# Patient Record
Sex: Female | Born: 1965 | Race: Black or African American | Hispanic: No | Smoking: Never smoker
Health system: Southern US, Community
[De-identification: ages and names within clinical notes are randomized; demographics above are authoritative.]

## PROBLEM LIST (undated history)

## (undated) DIAGNOSIS — J309 Allergic rhinitis, unspecified: Secondary | ICD-10-CM

## (undated) DIAGNOSIS — J45909 Unspecified asthma, uncomplicated: Secondary | ICD-10-CM

## (undated) DIAGNOSIS — I10 Essential (primary) hypertension: Secondary | ICD-10-CM

## (undated) DIAGNOSIS — E559 Vitamin D deficiency, unspecified: Secondary | ICD-10-CM

## (undated) DIAGNOSIS — Z9109 Other allergy status, other than to drugs and biological substances: Secondary | ICD-10-CM

## (undated) DIAGNOSIS — N920 Excessive and frequent menstruation with regular cycle: Secondary | ICD-10-CM

## (undated) DIAGNOSIS — R5383 Other fatigue: Secondary | ICD-10-CM

## (undated) DIAGNOSIS — M1990 Unspecified osteoarthritis, unspecified site: Secondary | ICD-10-CM

## (undated) DIAGNOSIS — R630 Anorexia: Secondary | ICD-10-CM

## (undated) HISTORY — DX: Vitamin D deficiency, unspecified: E55.9

## (undated) HISTORY — DX: Anorexia: R63.0

## (undated) HISTORY — DX: Excessive and frequent menstruation with regular cycle: N92.0

## (undated) HISTORY — PX: GASTRIC BYPASS: SHX52

## (undated) HISTORY — DX: Other fatigue: R53.83

## (undated) HISTORY — DX: Allergic rhinitis, unspecified: J30.9

---

## 2006-02-17 ENCOUNTER — Ambulatory Visit: Payer: Self-pay | Admitting: Unknown Physician Specialty

## 2009-03-04 ENCOUNTER — Ambulatory Visit: Payer: Self-pay | Admitting: Unknown Physician Specialty

## 2010-04-09 ENCOUNTER — Ambulatory Visit: Payer: Self-pay | Admitting: Unknown Physician Specialty

## 2011-04-13 ENCOUNTER — Ambulatory Visit: Payer: Self-pay | Admitting: Family Medicine

## 2012-05-25 ENCOUNTER — Ambulatory Visit: Payer: Self-pay | Admitting: Family Medicine

## 2013-08-09 ENCOUNTER — Ambulatory Visit: Payer: Self-pay | Admitting: Family Medicine

## 2014-09-02 ENCOUNTER — Other Ambulatory Visit: Payer: Self-pay | Admitting: Family Medicine

## 2014-09-02 DIAGNOSIS — Z1231 Encounter for screening mammogram for malignant neoplasm of breast: Secondary | ICD-10-CM

## 2014-09-03 ENCOUNTER — Ambulatory Visit
Admission: RE | Admit: 2014-09-03 | Discharge: 2014-09-03 | Disposition: A | Discharge status: Home / Self Care | Payer: BC Managed Care – PPO | Source: Ambulatory Visit | Attending: Family Medicine | Admitting: Family Medicine

## 2014-09-03 DIAGNOSIS — Z1231 Encounter for screening mammogram for malignant neoplasm of breast: Secondary | ICD-10-CM | POA: Insufficient documentation

## 2014-10-22 ENCOUNTER — Ambulatory Visit
Admission: RE | Admit: 2014-10-22 | Discharge: 2014-10-22 | Disposition: A | Discharge status: Home / Self Care | Payer: Worker's Compensation | Source: Ambulatory Visit | Attending: Physician Assistant | Admitting: Physician Assistant

## 2014-10-22 ENCOUNTER — Other Ambulatory Visit: Payer: Self-pay | Admitting: Physician Assistant

## 2014-10-22 DIAGNOSIS — M25562 Pain in left knee: Secondary | ICD-10-CM

## 2014-10-22 DIAGNOSIS — M25552 Pain in left hip: Secondary | ICD-10-CM

## 2014-12-27 ENCOUNTER — Ambulatory Visit (INDEPENDENT_AMBULATORY_CARE_PROVIDER_SITE_OTHER): Payer: BC Managed Care – PPO | Admitting: Family Medicine

## 2014-12-27 ENCOUNTER — Encounter: Payer: Self-pay | Admitting: Family Medicine

## 2014-12-27 VITALS — BP 142/80 | HR 86 | Temp 98.9°F | Resp 20 | Wt 263.0 lb

## 2014-12-27 DIAGNOSIS — J069 Acute upper respiratory infection, unspecified: Secondary | ICD-10-CM | POA: Diagnosis not present

## 2014-12-27 DIAGNOSIS — J301 Allergic rhinitis due to pollen: Secondary | ICD-10-CM | POA: Diagnosis not present

## 2014-12-27 DIAGNOSIS — I10 Essential (primary) hypertension: Secondary | ICD-10-CM

## 2014-12-27 DIAGNOSIS — J309 Allergic rhinitis, unspecified: Secondary | ICD-10-CM | POA: Insufficient documentation

## 2014-12-27 MED ORDER — HYDROCODONE-HOMATROPINE 5-1.5 MG/5ML PO SYRP: ORAL_SOLUTION | ORAL | Status: DC

## 2014-12-27 NOTE — Progress Notes (Signed)
Subjective:     Patient ID: Caitlin Salazar, female   DOB: [birth year], 49 y.o.   MRN: [id number]  HPI  Chief Complaint  Patient presents with  . URI    X 1 week. Patient reports that she has congestion in her head. She has been taking OTC Sudafed and Tylenol with no relief. She has had chills, but no body aches. Stuffy nose, ear fullness, and water eyes. Patient reports that she has been taking her allergy pill on a daily basis. She uses a humidifer at night. Patient reports that she also has a non-porductive cough.   States sx started on 10/10 with headache and sore throat. Now reports early purulent sinus congestion, sinus pressure, and PND with accompanying cough.   Review of Systems  Constitutional: Negative for fever.       Objective:   Physical Exam  Constitutional: She appears well-developed and well-nourished. No distress.  Ears: T.M's intact without inflammation Throat: no tonsillar enlargement or exudate Neck: no cervical adenopathy Lungs: clear     Assessment:    1. Upper respiratory infection - HYDROcodone-homatropine (HYCODAN) 5-1.5 MG/5ML syrup; 5 ml 4-6 hours as needed for cough  Dispense: 240 mL; Refill: 0    Plan:    Discussed use of Mucinex D and Delsym. Will call if sinuses not improving over the next two days.

## 2014-12-27 NOTE — Patient Instructions (Signed)
Discussed use of Mucinex D for congestion and Delsym for cough. 

## 2015-01-01 ENCOUNTER — Other Ambulatory Visit: Payer: Self-pay | Admitting: Obstetrics and Gynecology

## 2015-01-02 ENCOUNTER — Other Ambulatory Visit: Payer: Self-pay | Admitting: Obstetrics and Gynecology

## 2015-01-02 DIAGNOSIS — Z13820 Encounter for screening for osteoporosis: Secondary | ICD-10-CM

## 2015-01-08 ENCOUNTER — Ambulatory Visit: Payer: BC Managed Care – PPO

## 2015-01-14 ENCOUNTER — Ambulatory Visit
Admission: RE | Admit: 2015-01-14 | Discharge: 2015-01-14 | Disposition: A | Discharge status: Home / Self Care | Payer: BC Managed Care – PPO | Source: Ambulatory Visit | Attending: Obstetrics and Gynecology | Admitting: Obstetrics and Gynecology

## 2015-01-14 DIAGNOSIS — Z780 Asymptomatic menopausal state: Secondary | ICD-10-CM | POA: Diagnosis not present

## 2015-01-14 DIAGNOSIS — Z13820 Encounter for screening for osteoporosis: Secondary | ICD-10-CM

## 2015-01-16 ENCOUNTER — Ambulatory Visit: Payer: BC Managed Care – PPO

## 2015-02-28 ENCOUNTER — Other Ambulatory Visit: Payer: Self-pay | Admitting: *Deleted

## 2015-02-28 MED ORDER — FLUTICASONE PROPIONATE 50 MCG/ACT NA SUSP: 2.0000 | Freq: Every day | NASAL | Status: DC

## 2015-02-28 NOTE — Telephone Encounter (Signed)
Last ov 12/27/2014.

## 2015-03-03 ENCOUNTER — Telehealth: Payer: Self-pay | Admitting: Gastroenterology

## 2015-03-03 NOTE — Telephone Encounter (Signed)
Colonoscopy triage °

## 2015-03-13 ENCOUNTER — Other Ambulatory Visit: Payer: Self-pay

## 2015-03-13 ENCOUNTER — Telehealth: Payer: Self-pay

## 2015-03-13 DIAGNOSIS — E669 Obesity, unspecified: Secondary | ICD-10-CM | POA: Insufficient documentation

## 2015-03-13 DIAGNOSIS — I10 Essential (primary) hypertension: Secondary | ICD-10-CM | POA: Insufficient documentation

## 2015-03-13 DIAGNOSIS — E559 Vitamin D deficiency, unspecified: Secondary | ICD-10-CM | POA: Insufficient documentation

## 2015-03-13 DIAGNOSIS — Z8619 Personal history of other infectious and parasitic diseases: Secondary | ICD-10-CM | POA: Insufficient documentation

## 2015-03-13 DIAGNOSIS — R609 Edema, unspecified: Secondary | ICD-10-CM | POA: Insufficient documentation

## 2015-03-13 DIAGNOSIS — N920 Excessive and frequent menstruation with regular cycle: Secondary | ICD-10-CM | POA: Insufficient documentation

## 2015-03-13 DIAGNOSIS — R5383 Other fatigue: Secondary | ICD-10-CM | POA: Insufficient documentation

## 2015-03-13 DIAGNOSIS — Z8709 Personal history of other diseases of the respiratory system: Secondary | ICD-10-CM | POA: Insufficient documentation

## 2015-03-13 NOTE — Telephone Encounter (Signed)
Pt scheduled for screening colonoscopy at [facility] on Friday, Jan 27th.

## 2015-03-13 NOTE — Telephone Encounter (Signed)
Gastroenterology Pre-Procedure Review  Request Date: 04/11/15 Requesting Physician: Dr. Caryn Section  PATIENT REVIEW QUESTIONS: The patient responded to the following health history questions as indicated:    1. Are you having any GI issues? no 2. Do you have a personal history of Polyps? no 3. Do you have a family history of Colon Cancer or Polyps? no 4. Diabetes Mellitus? no 5. Joint replacements in the past 12 months?no 6. Major health problems in the past 3 months?no 7. Any artificial heart valves, MVP, or defibrillator?no    MEDICATIONS & ALLERGIES:    Patient reports the following regarding taking any anticoagulation/antiplatelet therapy:   Plavix, Coumadin, Eliquis, Xarelto, Lovenox, Pradaxa, Brilinta, or Effient? no Aspirin? no  Patient confirms/reports the following medications:  Current Outpatient Prescriptions  Medication Sig Dispense Refill  . albuterol (VENTOLIN HFA) 108 (90 Base) MCG/ACT inhaler Inhale into the lungs.    . fluticasone (FLONASE) 50 MCG/ACT nasal spray Place 2 sprays into both nostrils daily. 16 g 5  . ibuprofen (ADVIL,MOTRIN) 800 MG tablet daily as needed.    Marland Kitchen lisinopril (PRINIVIL,ZESTRIL) 20 MG tablet Take by mouth daily.    . meloxicam (MOBIC) 15 MG tablet Take by mouth daily as needed.    . methocarbamol (ROBAXIN) 500 MG tablet Take by mouth daily. Reported on 03/13/2015    . phentermine 30 MG capsule Take by mouth.    . topiramate (TOPAMAX) 50 MG tablet Take by mouth.    . traMADol (ULTRAM) 50 MG tablet Take by mouth every 6 (six) hours as needed.    . valACYclovir (VALTREX) 1000 MG tablet Take by mouth.    . furosemide (LASIX) 20 MG tablet Take by mouth. Reported on 03/13/2015     No current facility-administered medications for this visit.    Patient confirms/reports the following allergies:  Allergies  Allergen Reactions  . Penicillins     No orders of the defined types were placed in this encounter.    AUTHORIZATION INFORMATION Primary  Insurance: 1D#: Group #:  Secondary Insurance: 1D#: Group #:  SCHEDULE INFORMATION: Date: 04/11/15 Time: Location: [place]

## 2015-04-07 ENCOUNTER — Encounter: Payer: Self-pay | Admitting: *Deleted

## 2015-04-09 NOTE — Discharge Instructions (Signed)

## 2015-04-11 ENCOUNTER — Encounter
Admission: RE | Disposition: A | Discharge status: Home / Self Care | Payer: Self-pay | Source: Ambulatory Visit | Attending: Gastroenterology

## 2015-04-11 ENCOUNTER — Ambulatory Visit: Payer: BC Managed Care – PPO | Admitting: Anesthesiology

## 2015-04-11 ENCOUNTER — Other Ambulatory Visit: Payer: Self-pay | Admitting: Gastroenterology

## 2015-04-11 ENCOUNTER — Ambulatory Visit
Admission: RE | Admit: 2015-04-11 | Discharge: 2015-04-11 | Disposition: A | Discharge status: Home / Self Care | Payer: BC Managed Care – PPO | Source: Ambulatory Visit | Attending: Gastroenterology | Admitting: Gastroenterology

## 2015-04-11 DIAGNOSIS — Z9884 Bariatric surgery status: Secondary | ICD-10-CM | POA: Insufficient documentation

## 2015-04-11 DIAGNOSIS — D123 Benign neoplasm of transverse colon: Secondary | ICD-10-CM | POA: Diagnosis not present

## 2015-04-11 DIAGNOSIS — Z880 Allergy status to penicillin: Secondary | ICD-10-CM | POA: Insufficient documentation

## 2015-04-11 DIAGNOSIS — Z79899 Other long term (current) drug therapy: Secondary | ICD-10-CM | POA: Diagnosis not present

## 2015-04-11 DIAGNOSIS — I10 Essential (primary) hypertension: Secondary | ICD-10-CM | POA: Diagnosis not present

## 2015-04-11 DIAGNOSIS — K641 Second degree hemorrhoids: Secondary | ICD-10-CM | POA: Diagnosis not present

## 2015-04-11 DIAGNOSIS — D122 Benign neoplasm of ascending colon: Secondary | ICD-10-CM | POA: Insufficient documentation

## 2015-04-11 DIAGNOSIS — M1712 Unilateral primary osteoarthritis, left knee: Secondary | ICD-10-CM | POA: Insufficient documentation

## 2015-04-11 DIAGNOSIS — Z7951 Long term (current) use of inhaled steroids: Secondary | ICD-10-CM | POA: Insufficient documentation

## 2015-04-11 DIAGNOSIS — J45909 Unspecified asthma, uncomplicated: Secondary | ICD-10-CM | POA: Diagnosis not present

## 2015-04-11 DIAGNOSIS — Z1211 Encounter for screening for malignant neoplasm of colon: Secondary | ICD-10-CM | POA: Diagnosis not present

## 2015-04-11 HISTORY — DX: Other allergy status, other than to drugs and biological substances: Z91.09

## 2015-04-11 HISTORY — DX: Unspecified asthma, uncomplicated: J45.909

## 2015-04-11 HISTORY — DX: Essential (primary) hypertension: I10

## 2015-04-11 HISTORY — DX: Unspecified osteoarthritis, unspecified site: M19.90

## 2015-04-11 HISTORY — PX: POLYPECTOMY: SHX5525

## 2015-04-11 HISTORY — PX: COLONOSCOPY WITH PROPOFOL: SHX5780

## 2015-04-11 SURGERY — COLONOSCOPY WITH PROPOFOL
Anesthesia: Monitor Anesthesia Care | Wound class: Contaminated

## 2015-04-11 MED ORDER — LIDOCAINE HCL (CARDIAC) 20 MG/ML IV SOLN
INTRAVENOUS | Status: DC | PRN
  Administered 2015-04-11: 50 mg via INTRAVENOUS

## 2015-04-11 MED ORDER — SODIUM CHLORIDE 0.9 % IV SOLN: INTRAVENOUS | Status: DC

## 2015-04-11 MED ORDER — ACETAMINOPHEN 325 MG PO TABS: 325.0000 mg | ORAL_TABLET | ORAL | Status: DC | PRN

## 2015-04-11 MED ORDER — ACETAMINOPHEN 160 MG/5ML PO SOLN: 325.0000 mg | ORAL | Status: DC | PRN

## 2015-04-11 MED ORDER — PROPOFOL 10 MG/ML IV BOLUS
INTRAVENOUS | Status: DC | PRN
  Administered 2015-04-11: 10 mg via INTRAVENOUS
  Administered 2015-04-11: 30 mg via INTRAVENOUS
  Administered 2015-04-11: 50 mg via INTRAVENOUS
  Administered 2015-04-11: 10 mg via INTRAVENOUS
  Administered 2015-04-11 (×3): 20 mg via INTRAVENOUS
  Administered 2015-04-11: 30 mg via INTRAVENOUS
  Administered 2015-04-11 (×2): 20 mg via INTRAVENOUS
  Administered 2015-04-11: 30 mg via INTRAVENOUS
  Administered 2015-04-11: 20 mg via INTRAVENOUS

## 2015-04-11 MED ORDER — STERILE WATER FOR IRRIGATION IR SOLN
Status: DC | PRN
  Administered 2015-04-11: 10:00:00

## 2015-04-11 MED ORDER — LACTATED RINGERS IV SOLN
INTRAVENOUS | Status: DC
  Administered 2015-04-11: 09:00:00 via INTRAVENOUS

## 2015-04-11 SURGICAL SUPPLY — 28 items

## 2015-04-11 NOTE — Op Note (Signed)
[facility] Gastroenterology Patient Name: Caitlin Salazar Procedure Date: 04/11/2015 9:45 AM MRN: [id number] Account #: [id number] Date of Birth: [birth year] Admit Type: Outpatient Age: 50 Room: [facility] OR ROOM 01 Gender: Female Note Status: Finalized Procedure:         Colonoscopy Indications:       Screening for colorectal malignant neoplasm Providers:         Lucilla Lame, MD Referring MD:      Kirstie Peri. Caryn Section, MD (Referring MD) Medicines:         Propofol per Anesthesia Complications:     No immediate complications. Procedure:         Pre-Anesthesia Assessment:                    - Prior to the procedure, a History and Physical was                     performed, and patient medications and allergies were                     reviewed. The patient's tolerance of previous anesthesia                     was also reviewed. The risks and benefits of the procedure                     and the sedation options and risks were discussed with the                     patient. All questions were answered, and informed consent                     was obtained. Prior Anticoagulants: The patient has taken                     no previous anticoagulant or antiplatelet agents. ASA                     Grade Assessment: II - A patient with mild systemic                     disease. After reviewing the risks and benefits, the                     patient was deemed in satisfactory condition to undergo                     the procedure.                    After obtaining informed consent, the colonoscope was                     passed under direct vision. Throughout the procedure, the                     patient's blood pressure, pulse, and oxygen saturations                     were monitored continuously. The Olympus CF-HQ190L                     Colonoscope (S#. [id number]) was introduced through the anus  and advanced to the the cecum, identified by appendiceal                orifice and ileocecal valve. The colonoscopy was performed                     without difficulty. The patient tolerated the procedure                     well. The quality of the bowel preparation was excellent. Findings:      The perianal and digital rectal examinations were normal.      A 4 mm polyp was found in the ascending colon. The polyp was sessile.       The polyp was removed with a cold biopsy forceps. Resection and       retrieval were complete.      A 4 mm polyp was found in the transverse colon. The polyp was sessile.       The polyp was removed with a cold biopsy forceps. Resection and       retrieval were complete.      Non-bleeding internal hemorrhoids were found during retroflexion. The       hemorrhoids were Grade II (internal hemorrhoids that prolapse but reduce       spontaneously). Impression:        - One 4 mm polyp in the ascending colon. Resected and                     retrieved.                    - One 4 mm polyp in the transverse colon. Resected and                     retrieved.                    - Non-bleeding internal hemorrhoids. Recommendation:    - Await pathology results.                    - Repeat colonoscopy in 5 years if polyp adenoma and 10                     years if hyperplastic Procedure Code(s): --- Professional ---                    [phone], Colonoscopy, flexible; with biopsy, single or                     multiple Diagnosis Code(s): --- Professional ---                    Z12.11, Encounter for screening for malignant neoplasm of                     colon                    D12.2, Benign neoplasm of ascending colon                    D12.3, Benign neoplasm of transverse colon CPT copyright 2014 American Medical Association. All rights reserved. The codes documented in this report are preliminary and upon coder review may  be revised to meet current compliance requirements. Lucilla Lame, MD 04/11/2015 10:17:30 AM This report  has been signed  electronically. Number of Addenda: 0 Note Initiated On: 04/11/2015 9:45 AM Scope Withdrawal Time: 0 hours 8 minutes 51 seconds  Total Procedure Duration: 0 hours 14 minutes 14 seconds       [facility]

## 2015-04-11 NOTE — Anesthesia Postprocedure Evaluation (Signed)
Anesthesia Post Note  Patient: Caitlin Salazar  Procedure(s) Performed: Procedure(s) (LRB): COLONOSCOPY WITH PROPOFOL (N/A) POLYPECTOMY  Patient location during evaluation: PACU Anesthesia Type: MAC Level of consciousness: awake and alert and oriented Pain management: satisfactory to patient Vital Signs Assessment: post-procedure vital signs reviewed and stable Respiratory status: spontaneous breathing, nonlabored ventilation and respiratory function stable Cardiovascular status: blood pressure returned to baseline and stable Postop Assessment: Adequate PO intake and No signs of nausea or vomiting Anesthetic complications: no    Raliegh Ip

## 2015-04-11 NOTE — Anesthesia Preprocedure Evaluation (Signed)
Anesthesia Evaluation  Patient identified by MRN, date of birth, ID band  Reviewed: Allergy & Precautions, H&P , NPO status , Patient's Chart, lab work & pertinent test results  Airway Mallampati: II  TM Distance: >3 FB Neck ROM: full    Dental no notable dental hx.    Pulmonary asthma ,    Pulmonary exam normal        Cardiovascular hypertension,  Rhythm:regular Rate:Normal     Neuro/Psych    GI/Hepatic   Endo/Other    Renal/GU      Musculoskeletal   Abdominal   Peds  Hematology   Anesthesia Other Findings   Reproductive/Obstetrics                             Anesthesia Physical Anesthesia Plan  ASA: II  Anesthesia Plan: MAC   Post-op Pain Management:    Induction:   Airway Management Planned:   Additional Equipment:   Intra-op Plan:   Post-operative Plan:   Informed Consent: I have reviewed the patients History and Physical, chart, labs and discussed the procedure including the risks, benefits and alternatives for the proposed anesthesia with the patient or authorized representative who has indicated his/her understanding and acceptance.     Plan Discussed with: CRNA  Anesthesia Plan Comments:         Anesthesia Quick Evaluation

## 2015-04-11 NOTE — Anesthesia Procedure Notes (Signed)
Procedure Name: MAC Performed by: Eryn Krejci Pre-anesthesia Checklist: Patient identified, Emergency Drugs available, Suction available, Timeout performed and Patient being monitored Patient Re-evaluated:Patient Re-evaluated prior to inductionOxygen Delivery Method: Nasal cannula Placement Confirmation: positive ETCO2       

## 2015-04-11 NOTE — Transfer of Care (Signed)
Immediate Anesthesia Transfer of Care Note  Patient: Caitlin Salazar  Procedure(s) Performed: Procedure(s) with comments: COLONOSCOPY WITH PROPOFOL (N/A) - PLEASE LEAVE PT START TIME FOR 930 POLYPECTOMY  Patient Location: PACU  Anesthesia Type: MAC  Level of Consciousness: awake, alert  and patient cooperative  Airway and Oxygen Therapy: Patient Spontanous Breathing and Patient connected to supplemental oxygen  Post-op Assessment: Post-op Vital signs reviewed, Patient's Cardiovascular Status Stable, Respiratory Function Stable, Patent Airway and No signs of Nausea or vomiting  Post-op Vital Signs: Reviewed and stable  Complications: No apparent anesthesia complications

## 2015-04-11 NOTE — H&P (Signed)
[facility]  [address] [place], [place] [address] Phone: [phone] Fax : [phone]  Primary Care Physician:  Lelon Huh, MD Primary Gastroenterologist:  Dr. Allen Norris  Pre-Procedure History & Physical: HPI:  Caitlin Salazar is a 50 y.o. female is here for a screening colonoscopy.   Past Medical History  Diagnosis Date  . Environmental allergies   . Hypertension   . Asthma     when younger  . Arthritis     left knee    Past Surgical History  Procedure Laterality Date  . Gastric bypass    . Cesarean section      Prior to Admission medications   Medication Sig Start Date End Date Taking? Authorizing Provider  ibuprofen (ADVIL,MOTRIN) 800 MG tablet daily as needed. 10/22/14  Yes Historical Provider, MD  levocetirizine (XYZAL) 5 MG tablet Take 5 mg by mouth daily. AM   Yes Historical Provider, MD  lisinopril (PRINIVIL,ZESTRIL) 20 MG tablet Take by mouth daily. AM 12/06/14  Yes Historical Provider, MD  valACYclovir (VALTREX) 1000 MG tablet Take by mouth as needed.    Yes Historical Provider, MD  albuterol (VENTOLIN HFA) 108 (90 Base) MCG/ACT inhaler Inhale into the lungs. Reported on 04/11/2015 10/12/13   Historical Provider, MD  fluticasone (FLONASE) 50 MCG/ACT nasal spray Place 2 sprays into both nostrils daily. Patient not taking: Reported on 04/11/2015 02/28/15   Birdie Sons, MD  furosemide (LASIX) 20 MG tablet Take by mouth daily as needed. Reported on 04/11/2015 11/07/12   Historical Provider, MD  meloxicam (MOBIC) 15 MG tablet Take by mouth daily as needed. Reported on 04/11/2015 12/12/14   Historical Provider, MD  methocarbamol (ROBAXIN) 500 MG tablet Take by mouth daily as needed. Reported on 04/11/2015 12/12/14   Historical Provider, MD  phentermine 30 MG capsule Take by mouth as needed. Reported on 04/11/2015 05/31/14   Historical Provider, MD    Allergies as of 03/13/2015 - Review Complete 03/13/2015  Allergen Reaction Noted  . Penicillins   03/13/2015    History reviewed. No pertinent family history.  Social History   Social History  . Marital Status: Married    Spouse Name: N/A  . Number of Children: N/A  . Years of Education: N/A   Occupational History  . Not on file.   Social History Main Topics  . Smoking status: Never Smoker   . Smokeless tobacco: Not on file  . Alcohol Use: 0.0 oz/week    0 Standard drinks or equivalent per week     Comment: 1-2x/yr  . Drug Use: No  . Sexual Activity: Not on file   Other Topics Concern  . Not on file   Social History Narrative    Review of Systems: See HPI, otherwise negative ROS  Physical Exam: BP 179/89 mmHg  Pulse 73  Temp(Src) 97.5 F (36.4 C) (Temporal)  Resp 18  Ht 5\' 3"  (1.6 m)  Wt 269 lb (122.018 kg)  BMI 47.66 kg/m2  SpO2 100%  LMP 02/05/2015 (Approximate) General:   Alert,  pleasant and cooperative in NAD Head:  Normocephalic and atraumatic. Neck:  Supple; no masses or thyromegaly. Lungs:  Clear throughout to auscultation.    Heart:  Regular rate and rhythm. Abdomen:  Soft, nontender and nondistended. Normal bowel sounds, without guarding, and without rebound.   Neurologic:  Alert and  oriented x4;  grossly normal neurologically.  Impression/Plan: Caitlin Salazar is now here to undergo a screening colonoscopy.  Risks, benefits, and alternatives regarding  colonoscopy have been reviewed with the patient.  Questions have been answered.  All parties agreeable.

## 2015-04-14 ENCOUNTER — Encounter: Payer: Self-pay | Admitting: Gastroenterology

## 2015-04-15 ENCOUNTER — Encounter: Payer: Self-pay | Admitting: Gastroenterology

## 2015-09-03 ENCOUNTER — Other Ambulatory Visit: Payer: Self-pay | Admitting: Family Medicine

## 2015-10-07 ENCOUNTER — Other Ambulatory Visit: Payer: Self-pay | Admitting: Family Medicine

## 2015-10-07 DIAGNOSIS — Z1231 Encounter for screening mammogram for malignant neoplasm of breast: Secondary | ICD-10-CM

## 2015-10-13 ENCOUNTER — Encounter: Payer: Self-pay | Admitting: Radiology

## 2015-10-13 ENCOUNTER — Ambulatory Visit
Admission: RE | Admit: 2015-10-13 | Discharge: 2015-10-13 | Disposition: A | Discharge status: Home / Self Care | Payer: BC Managed Care – PPO | Source: Ambulatory Visit | Attending: Family Medicine | Admitting: Family Medicine

## 2015-10-13 DIAGNOSIS — Z1231 Encounter for screening mammogram for malignant neoplasm of breast: Secondary | ICD-10-CM | POA: Insufficient documentation

## 2016-01-19 ENCOUNTER — Other Ambulatory Visit: Payer: Self-pay | Admitting: Obstetrics and Gynecology

## 2016-01-19 DIAGNOSIS — Z1231 Encounter for screening mammogram for malignant neoplasm of breast: Secondary | ICD-10-CM

## 2016-01-19 DIAGNOSIS — Z13820 Encounter for screening for osteoporosis: Secondary | ICD-10-CM

## 2016-10-13 ENCOUNTER — Ambulatory Visit
Admission: RE | Admit: 2016-10-13 | Discharge: 2016-10-13 | Disposition: A | Discharge status: Home / Self Care | Payer: BC Managed Care – PPO | Source: Ambulatory Visit | Attending: Obstetrics and Gynecology | Admitting: Obstetrics and Gynecology

## 2016-10-13 DIAGNOSIS — Z1231 Encounter for screening mammogram for malignant neoplasm of breast: Secondary | ICD-10-CM | POA: Insufficient documentation

## 2016-10-13 DIAGNOSIS — J45909 Unspecified asthma, uncomplicated: Secondary | ICD-10-CM | POA: Insufficient documentation

## 2016-10-13 DIAGNOSIS — Z13820 Encounter for screening for osteoporosis: Secondary | ICD-10-CM

## 2016-10-13 DIAGNOSIS — Z780 Asymptomatic menopausal state: Secondary | ICD-10-CM | POA: Insufficient documentation

## 2016-12-12 ENCOUNTER — Ambulatory Visit (INDEPENDENT_AMBULATORY_CARE_PROVIDER_SITE_OTHER): Payer: BC Managed Care – PPO

## 2016-12-12 ENCOUNTER — Ambulatory Visit
Admission: EM | Admit: 2016-12-12 | Discharge: 2016-12-12 | Disposition: A | Discharge status: Home / Self Care | Payer: BC Managed Care – PPO | Attending: Family Medicine | Admitting: Family Medicine

## 2016-12-12 DIAGNOSIS — M791 Myalgia: Secondary | ICD-10-CM

## 2016-12-12 DIAGNOSIS — M5440 Lumbago with sciatica, unspecified side: Secondary | ICD-10-CM

## 2016-12-12 DIAGNOSIS — S161XXA Strain of muscle, fascia and tendon at neck level, initial encounter: Secondary | ICD-10-CM

## 2016-12-12 DIAGNOSIS — M62838 Other muscle spasm: Secondary | ICD-10-CM

## 2016-12-12 DIAGNOSIS — M7918 Myalgia, other site: Secondary | ICD-10-CM

## 2016-12-12 MED ORDER — METAXALONE 800 MG PO TABS: 800.0000 mg | ORAL_TABLET | Freq: Three times a day (TID) | ORAL | 0 refills | Status: DC

## 2016-12-12 MED ORDER — MELOXICAM 15 MG PO TABS: 15.0000 mg | ORAL_TABLET | Freq: Every day | ORAL | 0 refills | Status: DC
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
## 2017-03-21 DIAGNOSIS — G8929 Other chronic pain: Secondary | ICD-10-CM | POA: Insufficient documentation

## 2017-03-21 DIAGNOSIS — M7918 Myalgia, other site: Secondary | ICD-10-CM | POA: Insufficient documentation

## 2017-04-28 ENCOUNTER — Ambulatory Visit
Admission: RE | Admit: 2017-04-28 | Discharge: 2017-04-28 | Disposition: A | Discharge status: Home / Self Care | Payer: BC Managed Care – PPO | Source: Ambulatory Visit | Attending: Family Medicine | Admitting: Family Medicine

## 2017-04-28 ENCOUNTER — Other Ambulatory Visit: Payer: Self-pay | Admitting: Family Medicine

## 2017-04-28 DIAGNOSIS — M7989 Other specified soft tissue disorders: Secondary | ICD-10-CM | POA: Insufficient documentation

## 2017-04-28 DIAGNOSIS — M25571 Pain in right ankle and joints of right foot: Secondary | ICD-10-CM

## 2017-04-28 DIAGNOSIS — M79662 Pain in left lower leg: Secondary | ICD-10-CM

## 2017-04-28 DIAGNOSIS — M79661 Pain in right lower leg: Secondary | ICD-10-CM | POA: Insufficient documentation

## 2017-11-28 ENCOUNTER — Other Ambulatory Visit: Payer: Self-pay | Admitting: Obstetrics and Gynecology

## 2017-11-28 DIAGNOSIS — Z1231 Encounter for screening mammogram for malignant neoplasm of breast: Secondary | ICD-10-CM

## 2017-11-29 ENCOUNTER — Ambulatory Visit
Admission: RE | Admit: 2017-11-29 | Discharge: 2017-11-29 | Disposition: A | Discharge status: Home / Self Care | Payer: BC Managed Care – PPO | Source: Ambulatory Visit | Attending: Obstetrics and Gynecology | Admitting: Obstetrics and Gynecology

## 2017-11-29 ENCOUNTER — Encounter (INDEPENDENT_AMBULATORY_CARE_PROVIDER_SITE_OTHER): Payer: Self-pay

## 2017-11-29 ENCOUNTER — Ambulatory Visit: Payer: Self-pay

## 2017-11-29 DIAGNOSIS — Z1231 Encounter for screening mammogram for malignant neoplasm of breast: Secondary | ICD-10-CM | POA: Insufficient documentation

## 2017-12-08 ENCOUNTER — Other Ambulatory Visit: Payer: Self-pay | Admitting: Obstetrics and Gynecology

## 2017-12-08 DIAGNOSIS — N6489 Other specified disorders of breast: Secondary | ICD-10-CM

## 2017-12-08 DIAGNOSIS — R928 Other abnormal and inconclusive findings on diagnostic imaging of breast: Secondary | ICD-10-CM

## 2017-12-15 ENCOUNTER — Ambulatory Visit
Admission: RE | Admit: 2017-12-15 | Discharge: 2017-12-15 | Disposition: A | Discharge status: Home / Self Care | Payer: BC Managed Care – PPO | Source: Ambulatory Visit | Attending: Obstetrics and Gynecology | Admitting: Obstetrics and Gynecology

## 2017-12-15 DIAGNOSIS — R928 Other abnormal and inconclusive findings on diagnostic imaging of breast: Secondary | ICD-10-CM | POA: Diagnosis not present

## 2017-12-15 DIAGNOSIS — N6489 Other specified disorders of breast: Secondary | ICD-10-CM

## 2017-12-26 ENCOUNTER — Other Ambulatory Visit: Payer: Self-pay | Admitting: Obstetrics and Gynecology

## 2017-12-26 DIAGNOSIS — N6320 Unspecified lump in the left breast, unspecified quadrant: Secondary | ICD-10-CM

## 2017-12-26 DIAGNOSIS — R928 Other abnormal and inconclusive findings on diagnostic imaging of breast: Secondary | ICD-10-CM

## 2018-01-06 ENCOUNTER — Ambulatory Visit
Admission: RE | Admit: 2018-01-06 | Discharge: 2018-01-06 | Disposition: A | Discharge status: Home / Self Care | Payer: BC Managed Care – PPO | Source: Ambulatory Visit | Attending: Obstetrics and Gynecology | Admitting: Obstetrics and Gynecology

## 2018-01-06 DIAGNOSIS — R928 Other abnormal and inconclusive findings on diagnostic imaging of breast: Secondary | ICD-10-CM

## 2018-01-06 DIAGNOSIS — N6320 Unspecified lump in the left breast, unspecified quadrant: Secondary | ICD-10-CM

## 2018-01-06 HISTORY — PX: BREAST BIOPSY: SHX20

## 2018-01-09 LAB — SURGICAL PATHOLOGY

## 2018-11-15 ENCOUNTER — Other Ambulatory Visit: Payer: Self-pay

## 2018-11-15 MED ORDER — LISINOPRIL 20 MG PO TABS: 20.0000 mg | ORAL_TABLET | Freq: Every day | ORAL | 3 refills | Status: DC

## 2019-01-11 ENCOUNTER — Other Ambulatory Visit: Payer: Self-pay | Admitting: Obstetrics and Gynecology

## 2019-01-11 DIAGNOSIS — Z1231 Encounter for screening mammogram for malignant neoplasm of breast: Secondary | ICD-10-CM

## 2019-01-15 ENCOUNTER — Inpatient Hospital Stay: Admission: RE | Admit: 2019-01-15 | Payer: BC Managed Care – PPO | Source: Ambulatory Visit

## 2019-01-19 ENCOUNTER — Other Ambulatory Visit: Payer: Self-pay

## 2019-01-19 DIAGNOSIS — Z20822 Contact with and (suspected) exposure to covid-19: Secondary | ICD-10-CM

## 2019-01-20 LAB — NOVEL CORONAVIRUS, NAA: SARS-CoV-2, NAA: NOT DETECTED

## 2019-01-29 ENCOUNTER — Other Ambulatory Visit: Payer: Self-pay

## 2019-01-29 DIAGNOSIS — Z20822 Contact with and (suspected) exposure to covid-19: Secondary | ICD-10-CM

## 2019-01-30 LAB — INPATIENT

## 2019-01-30 LAB — NOVEL CORONAVIRUS, NAA: SARS-CoV-2, NAA: NOT DETECTED

## 2019-02-05 ENCOUNTER — Other Ambulatory Visit: Payer: Self-pay

## 2019-02-06 ENCOUNTER — Other Ambulatory Visit: Payer: Self-pay

## 2019-02-06 DIAGNOSIS — Z20822 Contact with and (suspected) exposure to covid-19: Secondary | ICD-10-CM

## 2019-02-08 LAB — NOVEL CORONAVIRUS, NAA: SARS-CoV-2, NAA: NOT DETECTED

## 2019-02-12 ENCOUNTER — Ambulatory Visit (LOCAL_COMMUNITY_HEALTH_CENTER): Payer: BC Managed Care – PPO

## 2019-02-12 ENCOUNTER — Other Ambulatory Visit: Payer: Self-pay

## 2019-02-12 DIAGNOSIS — Z23 Encounter for immunization: Secondary | ICD-10-CM | POA: Diagnosis not present

## 2019-02-12 DIAGNOSIS — Z111 Encounter for screening for respiratory tuberculosis: Secondary | ICD-10-CM

## 2019-02-14 ENCOUNTER — Ambulatory Visit
Admission: RE | Admit: 2019-02-14 | Discharge: 2019-02-14 | Disposition: A | Discharge status: Home / Self Care | Payer: BC Managed Care – PPO | Source: Ambulatory Visit | Attending: Obstetrics and Gynecology | Admitting: Obstetrics and Gynecology

## 2019-02-14 ENCOUNTER — Other Ambulatory Visit: Payer: Self-pay

## 2019-02-14 DIAGNOSIS — Z1231 Encounter for screening mammogram for malignant neoplasm of breast: Secondary | ICD-10-CM | POA: Insufficient documentation

## 2019-02-14 DIAGNOSIS — Z20822 Contact with and (suspected) exposure to covid-19: Secondary | ICD-10-CM

## 2019-02-15 ENCOUNTER — Ambulatory Visit (LOCAL_COMMUNITY_HEALTH_CENTER): Payer: BC Managed Care – PPO

## 2019-02-15 ENCOUNTER — Other Ambulatory Visit: Payer: Self-pay

## 2019-02-15 ENCOUNTER — Other Ambulatory Visit: Payer: BC Managed Care – PPO

## 2019-02-15 DIAGNOSIS — Z111 Encounter for screening for respiratory tuberculosis: Secondary | ICD-10-CM

## 2019-02-15 LAB — TB SKIN TEST
Induration: 0 mm
TB Skin Test: NEGATIVE

## 2019-02-16 LAB — NOVEL CORONAVIRUS, NAA: SARS-CoV-2, NAA: NOT DETECTED

## 2019-03-23 ENCOUNTER — Telehealth: Payer: Self-pay | Admitting: Registered Nurse

## 2019-03-23 ENCOUNTER — Encounter: Payer: Self-pay | Admitting: Registered Nurse

## 2019-03-23 MED ORDER — FLUTICASONE PROPIONATE 50 MCG/ACT NA SUSP: 2.0000 | Freq: Every day | NASAL | 5 refills | Status: AC

## 2019-03-23 NOTE — Telephone Encounter (Signed)
Requesting refill of flonase, electronic Rx sent to her pharmacy of choice.

## 2019-04-30 ENCOUNTER — Ambulatory Visit: Payer: BC Managed Care – PPO

## 2019-05-07 ENCOUNTER — Ambulatory Visit: Payer: Self-pay | Admitting: Physician Assistant

## 2019-05-07 ENCOUNTER — Other Ambulatory Visit: Payer: Self-pay

## 2019-05-07 ENCOUNTER — Encounter: Payer: Self-pay | Admitting: Physician Assistant

## 2019-05-07 DIAGNOSIS — Z299 Encounter for prophylactic measures, unspecified: Secondary | ICD-10-CM

## 2019-05-07 DIAGNOSIS — Z113 Encounter for screening for infections with a predominantly sexual mode of transmission: Secondary | ICD-10-CM

## 2019-05-07 LAB — WET PREP FOR TRICH, YEAST, CLUE
Trichomonas Exam: NEGATIVE
Yeast Exam: NEGATIVE

## 2019-05-07 MED ORDER — CLOTRIMAZOLE 1 % VA CREA: 1.0000 | TOPICAL_CREAM | Freq: Every day | VAGINAL | 0 refills | Status: AC

## 2019-05-07 NOTE — Progress Notes (Signed)
[facility] Department STI clinic/screening visit  Subjective:  Caitlin Salazar is a 54 y.o. female being seen today for an STI screening visit. The patient reports they do have symptoms.  Patient reports that they do not desire a pregnancy in the next year.   They reported they are not interested in discussing contraception today.  Patient's last menstrual period was 09/29/2016.   Patient has the following medical conditions:   Patient Active Problem List   Diagnosis Date Noted  . Special screening for malignant neoplasms, colon   . Benign neoplasm of ascending colon   . Benign neoplasm of transverse colon   . History of chicken pox 03/13/2015  . Accumulation of fluid in tissues 03/13/2015  . Fatigue 03/13/2015  . History of asthma 03/13/2015  . BP (high blood pressure) 03/13/2015  . Adiposity 03/13/2015  . Excess, menstruation 03/13/2015  . Avitaminosis D 03/13/2015  . Hypertension 12/27/2014  . Allergic rhinitis 12/27/2014    Chief Complaint  Patient presents with  . SEXUALLY TRANSMITTED DISEASE    HPI  Patient reports that she would like a screening today due to having a new partner and having some burning for a couple of days that has now resolved.  Patient reports that she is postmenopausal and has been for about 2 years.   See flowsheet for further details and programmatic requirements.    The following portions of the patient's history were reviewed and updated as appropriate: allergies, current medications, past medical history, past social history, past surgical history and problem list.  Objective:  There were no vitals filed for this visit.  Physical Exam Constitutional:      General: She is not in acute distress.    Appearance: Normal appearance. She is obese.  HENT:     Head: Normocephalic and atraumatic.     Comments: No nits, lice, or hair loss. No cervical, supraclavicular or axillary adenopathy.    Mouth/Throat:     Mouth: Mucous  membranes are moist.     Pharynx: Oropharynx is clear. No oropharyngeal exudate or posterior oropharyngeal erythema.  Eyes:     Conjunctiva/sclera: Conjunctivae normal.  Pulmonary:     Effort: Pulmonary effort is normal.  Abdominal:     Palpations: Abdomen is soft. There is no mass.     Tenderness: There is no abdominal tenderness. There is no guarding or rebound.  Genitourinary:    General: Normal vulva.     Rectum: Normal.     Comments: External genitalia/pubic area without nits, lice, edema, erythema, lesions and inguinal adenopathy. Vagina with mucosa dry and friable, no discharge visualized. Cervix without visible lesions. Uterus firm, mobile, nt, no masses, no CMT, no adnexal tenderness. Musculoskeletal:     Cervical back: Neck supple. No tenderness.  Skin:    General: Skin is warm and dry.     Findings: No bruising, erythema, lesion or rash.  Neurological:     Mental Status: She is alert and oriented to person, place, and time.  Psychiatric:        Mood and Affect: Mood normal.        Behavior: Behavior normal.        Thought Content: Thought content normal.        Judgment: Judgment normal.      Assessment and Plan:  [name] is a 54 y.o. female presenting to the [facility] Department for STI screening  1. Screening for STD (sexually transmitted disease) Patient into clinic with  symptoms. Rec condoms with all sex. Await test results.  Counseled that RN will call if needs to RTC for further treatment once results are back.  - WET PREP FOR [place], YEAST, CLUE - Chlamydia/Gonorrhea [place] [facility] - HIV [place] [facility] - Syphilis Serology, [place] [facility]  2. Prophylactic measure Will treat for possible yeast with Clotrimazole 1% cream 1 app qhs for 7 days. Counseled patient that yeast can lead to burning and vaginal dryness. - clotrimazole (CLOTRIMAZOLE-7) 1 % vaginal cream; Place 1 Applicatorful vaginally at bedtime for 7 days.  Dispense: 45 g; Refill:  0     No follow-ups on file.  No future appointments.  Jerene Dilling, PA

## 2020-01-23 ENCOUNTER — Other Ambulatory Visit: Payer: Self-pay | Admitting: Obstetrics and Gynecology

## 2020-01-23 DIAGNOSIS — Z1231 Encounter for screening mammogram for malignant neoplasm of breast: Secondary | ICD-10-CM

## 2020-01-25 ENCOUNTER — Other Ambulatory Visit: Payer: Self-pay

## 2020-01-25 ENCOUNTER — Ambulatory Visit (LOCAL_COMMUNITY_HEALTH_CENTER): Payer: Self-pay

## 2020-01-25 DIAGNOSIS — Z111 Encounter for screening for respiratory tuberculosis: Secondary | ICD-10-CM

## 2020-01-28 ENCOUNTER — Ambulatory Visit (LOCAL_COMMUNITY_HEALTH_CENTER): Payer: BC Managed Care – PPO

## 2020-01-28 ENCOUNTER — Other Ambulatory Visit: Payer: Self-pay

## 2020-01-28 DIAGNOSIS — Z111 Encounter for screening for respiratory tuberculosis: Secondary | ICD-10-CM

## 2020-01-28 LAB — TB SKIN TEST
Induration: 0 mm
TB Skin Test: NEGATIVE

## 2020-02-11 ENCOUNTER — Encounter: Payer: Self-pay | Admitting: Emergency Medicine

## 2020-02-11 ENCOUNTER — Emergency Department
Admission: EM | Admit: 2020-02-11 | Discharge: 2020-02-11 | Disposition: A | Discharge status: Home / Self Care | Payer: BC Managed Care – PPO | Attending: Emergency Medicine | Admitting: Emergency Medicine

## 2020-02-11 ENCOUNTER — Other Ambulatory Visit: Payer: Self-pay

## 2020-02-11 ENCOUNTER — Emergency Department: Payer: BC Managed Care – PPO

## 2020-02-11 DIAGNOSIS — J45909 Unspecified asthma, uncomplicated: Secondary | ICD-10-CM | POA: Insufficient documentation

## 2020-02-11 DIAGNOSIS — Z79899 Other long term (current) drug therapy: Secondary | ICD-10-CM | POA: Insufficient documentation

## 2020-02-11 DIAGNOSIS — I10 Essential (primary) hypertension: Secondary | ICD-10-CM | POA: Diagnosis not present

## 2020-02-11 DIAGNOSIS — M25571 Pain in right ankle and joints of right foot: Secondary | ICD-10-CM | POA: Insufficient documentation

## 2020-02-11 DIAGNOSIS — S8002XA Contusion of left knee, initial encounter: Secondary | ICD-10-CM | POA: Insufficient documentation

## 2020-02-11 DIAGNOSIS — S8992XA Unspecified injury of left lower leg, initial encounter: Secondary | ICD-10-CM | POA: Diagnosis present

## 2020-02-11 MED ORDER — NAPROXEN 500 MG PO TABS: 500.0000 mg | ORAL_TABLET | Freq: Two times a day (BID) | ORAL | 2 refills | Status: DC
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
## 2020-02-19 ENCOUNTER — Telehealth: Payer: BC Managed Care – PPO

## 2020-02-20 ENCOUNTER — Other Ambulatory Visit: Payer: Self-pay

## 2020-02-20 ENCOUNTER — Ambulatory Visit
Admission: RE | Admit: 2020-02-20 | Discharge: 2020-02-20 | Disposition: A | Discharge status: Home / Self Care | Payer: BC Managed Care – PPO | Source: Ambulatory Visit | Attending: Obstetrics and Gynecology | Admitting: Obstetrics and Gynecology

## 2020-02-20 DIAGNOSIS — Z1231 Encounter for screening mammogram for malignant neoplasm of breast: Secondary | ICD-10-CM | POA: Diagnosis not present

## 2020-08-18 IMAGING — MG DIGITAL SCREENING BILATERAL MAMMOGRAM WITH TOMO AND CAD
8 of 12 series · 8 of 28 positions shown · non-contrast
Comparison: Previous exam(s).

CLINICAL DATA: Screening.

EXAM:
DIGITAL SCREENING BILATERAL MAMMOGRAM WITH TOMO AND CAD

[R CC synth-2D]
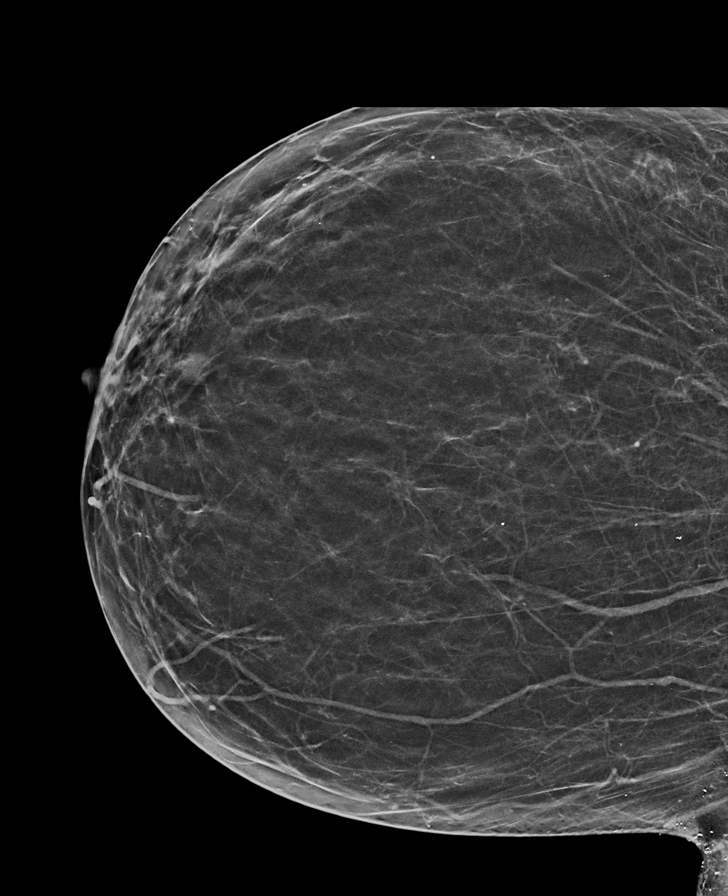

[L CC synth-2D]
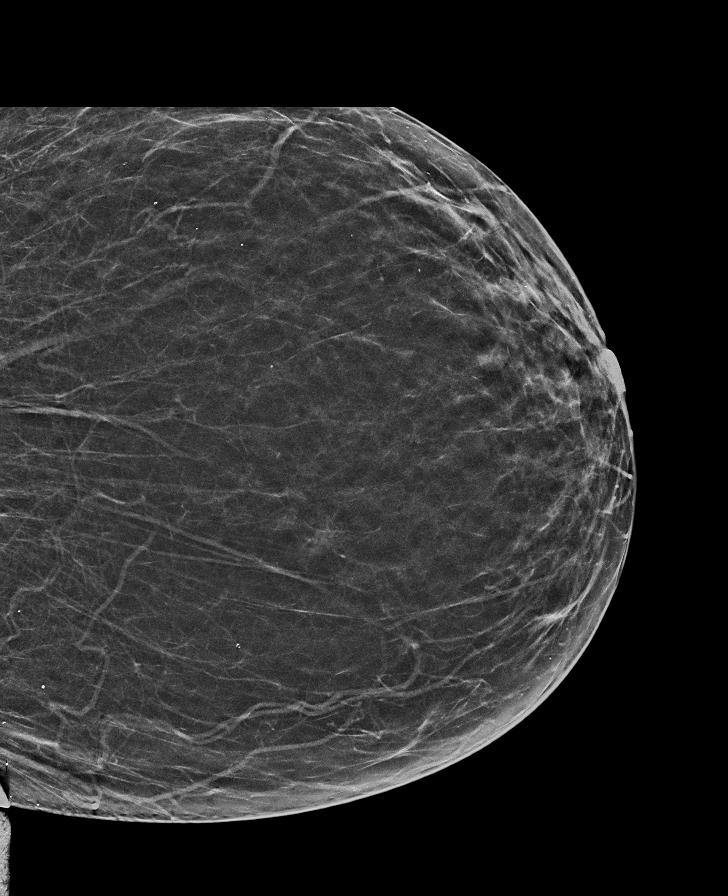

[R MLO synth-2D]
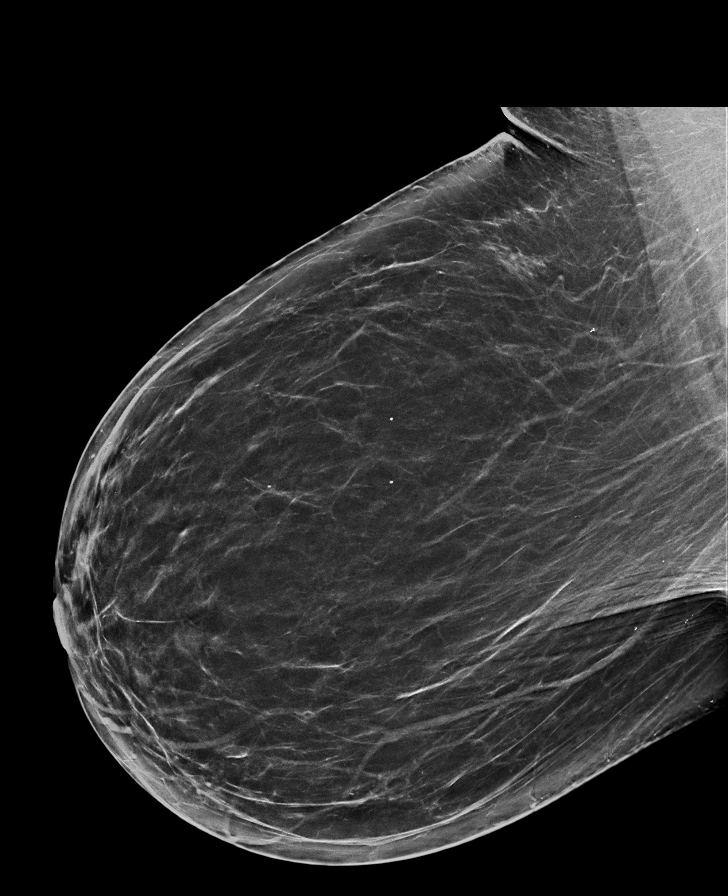

[L MLO synth-2D]
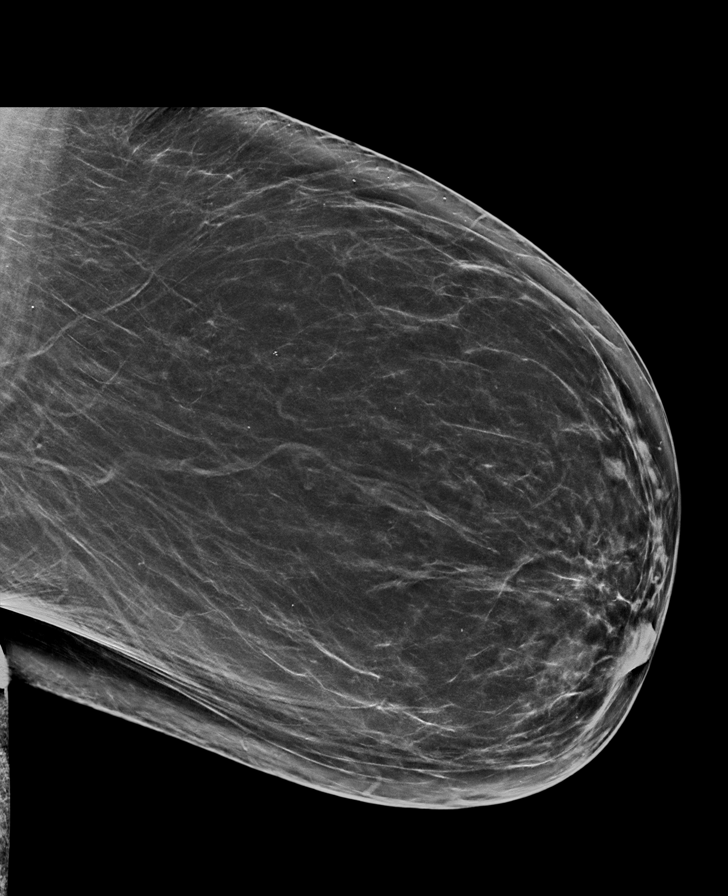

[L CC (1 of 2)]
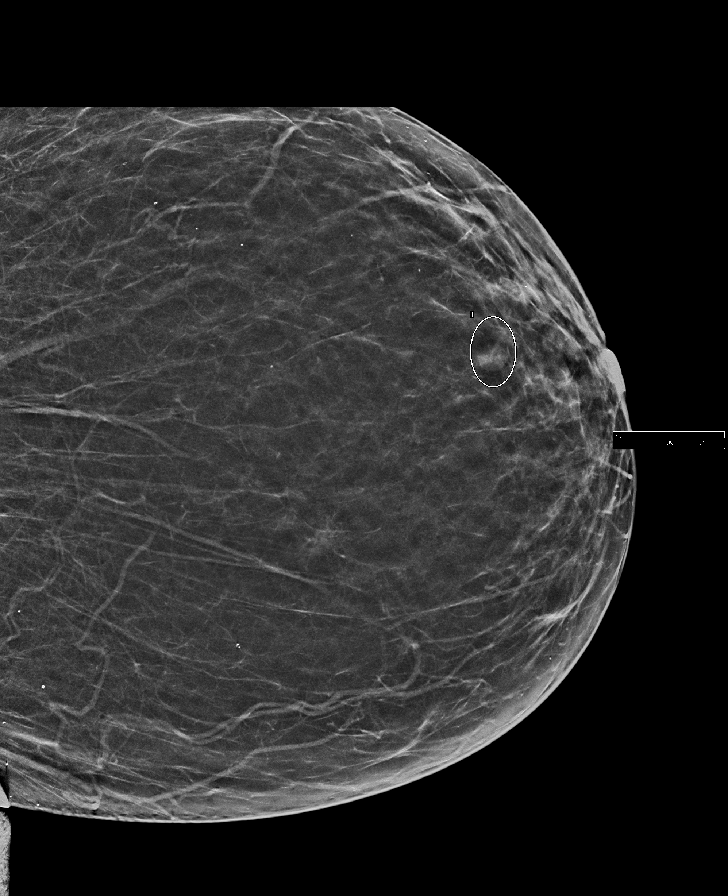

[L MLO (1 of 2)]
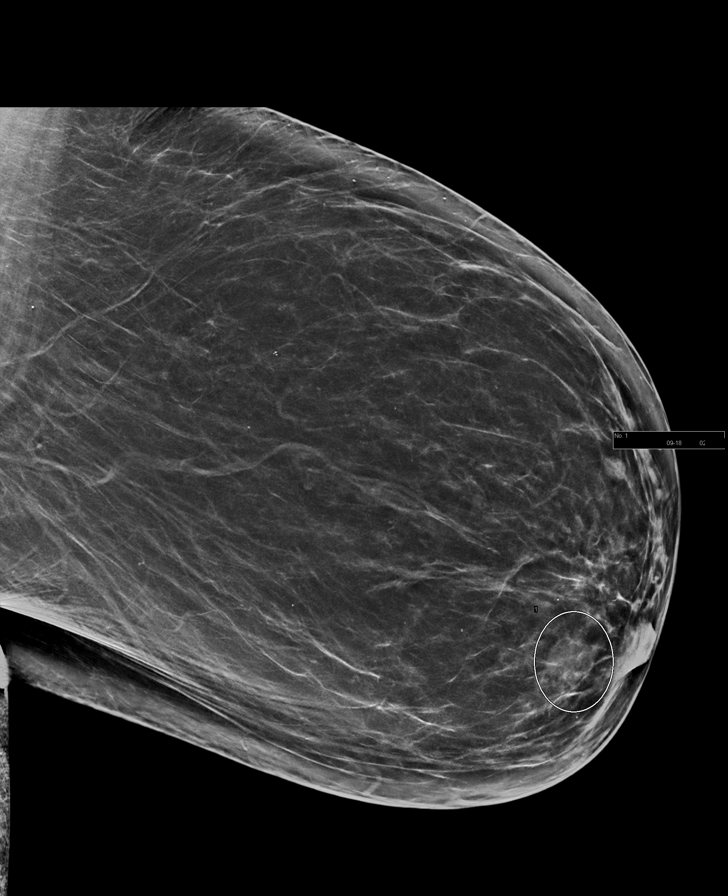

[L MLO (2 of 2)]
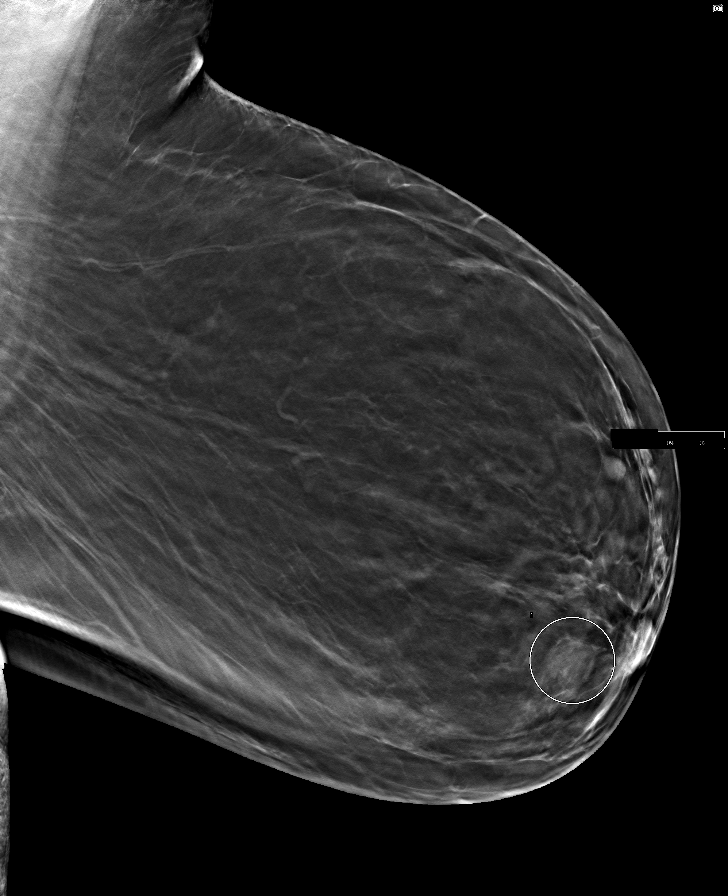

[L CC (2 of 2)]
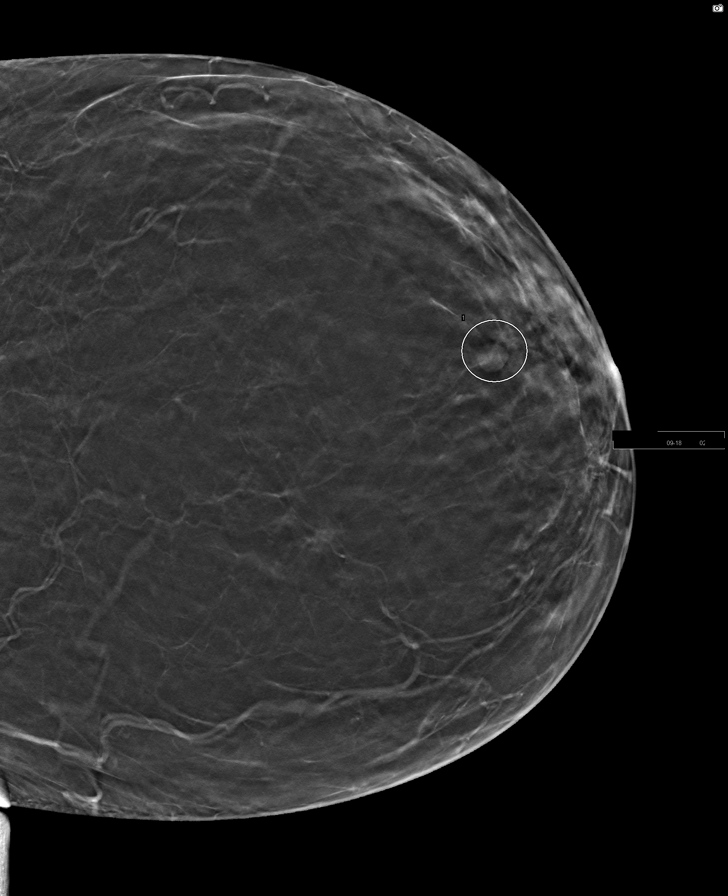

[8 of 28 positions shown; findings below may reference images not displayed]

ACR Breast Density Category b: There are scattered areas of
fibroglandular density.
FINDINGS: In the left breast, a possible asymmetry warrants further
evaluation. In the right breast, no findings suspicious for
malignancy. Images were processed with CAD.
IMPRESSION: Further evaluation is suggested for possible asymmetry in the left
breast.

RECOMMENDATION:
Diagnostic mammogram and possibly ultrasound of the left breast.
(Code:DI-5-LL4)

The patient will be contacted regarding the findings, and additional
imaging will be scheduled.

BI-RADS CATEGORY  0: Incomplete. Need additional imaging evaluation
and/or prior mammograms for comparison.

## 2020-08-26 ENCOUNTER — Other Ambulatory Visit: Payer: Self-pay

## 2020-08-26 ENCOUNTER — Ambulatory Visit: Payer: Self-pay | Admitting: Advanced Practice Midwife

## 2020-08-26 ENCOUNTER — Encounter: Payer: Self-pay | Admitting: Advanced Practice Midwife

## 2020-08-26 DIAGNOSIS — Z113 Encounter for screening for infections with a predominantly sexual mode of transmission: Secondary | ICD-10-CM

## 2020-08-26 DIAGNOSIS — T7411XA Adult physical abuse, confirmed, initial encounter: Secondary | ICD-10-CM | POA: Insufficient documentation

## 2020-08-26 DIAGNOSIS — T7411XS Adult physical abuse, confirmed, sequela: Secondary | ICD-10-CM

## 2020-08-26 LAB — WET PREP FOR TRICH, YEAST, CLUE
Trichomonas Exam: NEGATIVE
Yeast Exam: NEGATIVE

## 2020-08-26 NOTE — Progress Notes (Signed)
Wet mount reviewed, no tx per standing orders. Provider orders completed.

## 2020-08-26 NOTE — Progress Notes (Signed)
[facility] Department STI clinic/screening visit  Subjective:  Caitlin Salazar is a 55 y.o. seperated BF G1P1 nonsmoker female being seen today for an STI screening visit because her primary care MD is closed.  The patient reports they do have symptoms.  Patient reports that they do not desire a pregnancy in the next year.   They reported they are not interested in discussing contraception today.  Patient's last menstrual period was 09/29/2016.   Patient has the following medical conditions:   Patient Active Problem List   Diagnosis Date Noted   Special screening for malignant neoplasms, colon    Benign neoplasm of ascending colon    Benign neoplasm of transverse colon    History of chicken pox 03/13/2015   Accumulation of fluid in tissues 03/13/2015   Fatigue 03/13/2015   History of asthma 03/13/2015   BP (high blood pressure) 03/13/2015   Adiposity 03/13/2015   Excess, menstruation 03/13/2015   Avitaminosis D 03/13/2015   Hypertension 12/27/2014   Allergic rhinitis 12/27/2014    Chief Complaint  Patient presents with   SEXUALLY TRANSMITTED DISEASE    screening    HPI  Patient reports finished antibiotics on 08/23/20 for sinus infection and began having internal burning when voids so used Monistat cream on 6/10, 6/11, and 08/24/20 without relief. Last sex 06/2020 with condom; with current partner x 3 mo. LMP 09/29/16. Last MJ age 28. Last ETOH 08/24/20 (2 wine coolers)1x/mo.  Last HIV test per patient/review of record was 05/07/19 Patient reports last pap was 04/2020 neg  See flowsheet for further details and programmatic requirements.    The following portions of the patient's history were reviewed and updated as appropriate: allergies, current medications, past medical history, past social history, past surgical history and problem list.  Objective:  There were no vitals filed for this visit.  Physical Exam Vitals and nursing note reviewed.   Constitutional:      Appearance: Normal appearance. She is obese.  HENT:     Head: Normocephalic and atraumatic.     Mouth/Throat:     Mouth: Mucous membranes are moist.     Pharynx: Oropharynx is clear. No oropharyngeal exudate or posterior oropharyngeal erythema.  Pulmonary:     Effort: Pulmonary effort is normal.  Chest:  Breasts:    Right: No axillary adenopathy or supraclavicular adenopathy.     Left: No axillary adenopathy or supraclavicular adenopathy.  Abdominal:     General: Abdomen is protuberant.     Palpations: Abdomen is soft. There is no mass.     Tenderness: There is no abdominal tenderness. There is no guarding or rebound.     Comments: Increased adipose, poor tone, soft without masses or tenderness  Genitourinary:    General: Normal vulva.     Exam position: Lithotomy position.     Pubic Area: No rash or pubic lice.      Labia:        Right: No rash or lesion.        Left: No rash or lesion.      Vagina: Normal. No vaginal discharge (no leukorrhea seen, ph inconclusive), erythema, bleeding or lesions.     Cervix: Normal.     Rectum: Normal.     Comments: Unable to assess uterus or adnexa due to increased adipose tissue Lymphadenopathy:     Head:     Right side of head: No preauricular or posterior auricular adenopathy.     Left side of head: No  preauricular or posterior auricular adenopathy.     Cervical: No cervical adenopathy.     Upper Body:     Right upper body: No supraclavicular or axillary adenopathy.     Left upper body: No supraclavicular or axillary adenopathy.     Lower Body: No right inguinal adenopathy. No left inguinal adenopathy.  Skin:    General: Skin is warm and dry.     Findings: No rash.  Neurological:     Mental Status: She is alert and oriented to person, place, and time.     Assessment and Plan:  Caitlin Salazar is a 55 y.o. female presenting to the [facility] Department for STI screening  1. Screening  examination for venereal disease Treat wet mount per standing orders Immunization nurse consult - WET PREP FOR [place], YEAST, CLUE - Syphilis Serology, [place] [facility] - HIV [place] [facility] - Chlamydia/Gonorrhea [place] [facility]     No follow-ups on file.  No future appointments.  Herbie Saxon, CNM

## 2020-10-14 ENCOUNTER — Ambulatory Visit: Payer: BC Managed Care – PPO | Admitting: Podiatry

## 2020-10-14 ENCOUNTER — Other Ambulatory Visit: Payer: Self-pay

## 2020-10-14 ENCOUNTER — Ambulatory Visit (INDEPENDENT_AMBULATORY_CARE_PROVIDER_SITE_OTHER): Payer: BC Managed Care – PPO

## 2020-10-14 DIAGNOSIS — S9002XA Contusion of left ankle, initial encounter: Secondary | ICD-10-CM

## 2020-10-14 DIAGNOSIS — M7662 Achilles tendinitis, left leg: Secondary | ICD-10-CM

## 2020-10-14 MED ORDER — METHYLPREDNISOLONE 4 MG PO TBPK: ORAL_TABLET | ORAL | 0 refills | Status: DC

## 2020-10-14 MED ORDER — BETAMETHASONE SOD PHOS & ACET 6 (3-3) MG/ML IJ SUSP
3.0000 mg | Freq: Once | INTRAMUSCULAR | Status: AC
  Administered 2020-10-14: 3 mg via INTRA_ARTICULAR

## 2020-10-14 NOTE — Progress Notes (Signed)
   HPI: 55 y.o. female presenting today as a new patient for evaluation of left posterior ankle pain has been going on for about 2 months now.  Gradual onset.  She denies a history of injury.  She states that when she rests her foot it feels better.  She experiences dull aches with walking.  She has not done anything for treatment.    Past Medical History:  Diagnosis Date   Arthritis    left knee   Asthma    when younger   Environmental allergies    Hypertension       Physical Exam: General: The patient is alert and oriented x3 in no acute distress.  Dermatology: Skin is warm, dry and supple bilateral lower extremities. Negative for open lesions or macerations.  Vascular: Palpable pedal pulses bilaterally. No edema or erythema noted. Capillary refill within normal limits.  Neurological: Epicritic and protective threshold grossly intact bilaterally.   Musculoskeletal Exam: Pain on palpation noted to the posterior tubercle of the left calcaneus at the insertion of the Achilles tendon consistent with retrocalcaneal bursitis. Range of motion within normal limits. Muscle strength 5/5 in all muscle groups bilateral lower extremities.  Radiographic Exam:  Posterior calcaneal spur noted to the respective calcaneus on lateral view. No fracture or dislocation noted. Normal osseous mineralization noted.     Assessment: 1. Insertional Achilles tendinitis left 2. Retrocalcaneal bursitis   Plan of Care:  1. Patient was evaluated. Radiographs were reviewed today. 2. Injection of 0.5 mL Celestone Soluspan injected into the retrocalcaneal bursa. Care was taken to avoid direct injection into the Achilles tendon. 3.  Prescription for Medrol Dosepak.  Then resume meloxicam 15 mg daily that the patient has at home 4.  Ankle brace dispensed.  Wear daily 5.  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 Edrick Kins, [place]                                        [place], [place] [address]                Office [phone]  Fax [phone]

## 2020-11-06 ENCOUNTER — Telehealth: Payer: Self-pay

## 2020-11-06 NOTE — Telephone Encounter (Signed)
Patient called stated that she is still having discomfort on the back of her heel.  She says the brace seems to be making it worse.  She is still taking the meloxicam and soaking in Epson salt when she can.  She also stated that since school is starting back she has been walking a lot more.  I instructed her to continue Meloxicam, try a Tylenol for pain and continue soaking as she can.  If symptoms become worse to call office and schedule another appt with Dr. Amalia Hailey.  She verbalized understanding

## 2020-12-16 ENCOUNTER — Encounter: Payer: Self-pay | Admitting: *Deleted

## 2020-12-16 ENCOUNTER — Inpatient Hospital Stay: Payer: BC Managed Care – PPO

## 2020-12-16 ENCOUNTER — Inpatient Hospital Stay: Payer: BC Managed Care – PPO | Attending: Internal Medicine | Admitting: Internal Medicine

## 2020-12-16 DIAGNOSIS — I10 Essential (primary) hypertension: Secondary | ICD-10-CM | POA: Diagnosis not present

## 2020-12-16 DIAGNOSIS — Z9884 Bariatric surgery status: Secondary | ICD-10-CM | POA: Insufficient documentation

## 2020-12-16 DIAGNOSIS — Z7951 Long term (current) use of inhaled steroids: Secondary | ICD-10-CM | POA: Diagnosis not present

## 2020-12-16 DIAGNOSIS — Z79899 Other long term (current) drug therapy: Secondary | ICD-10-CM | POA: Insufficient documentation

## 2020-12-16 DIAGNOSIS — D649 Anemia, unspecified: Secondary | ICD-10-CM

## 2020-12-16 DIAGNOSIS — Z803 Family history of malignant neoplasm of breast: Secondary | ICD-10-CM | POA: Diagnosis not present

## 2020-12-16 LAB — FERRITIN: Ferritin: 29 ng/mL (ref 11–307)

## 2020-12-16 LAB — BASIC METABOLIC PANEL
Anion gap: 7 (ref 5–15)
BUN: 33 mg/dL — ABNORMAL HIGH (ref 6–20)
CO2: 21 mmol/L — ABNORMAL LOW (ref 22–32)
Calcium: 9.3 mg/dL (ref 8.9–10.3)
Chloride: 105 mmol/L (ref 98–111)
Creatinine, Ser: 1.51 mg/dL — ABNORMAL HIGH (ref 0.44–1.00)
GFR, Estimated: 40 mL/min — ABNORMAL LOW (ref >60)
Glucose, Bld: 106 mg/dL — ABNORMAL HIGH (ref 70–99)
Potassium: 5.3 mmol/L — ABNORMAL HIGH (ref 3.5–5.1)
Sodium: 133 mmol/L — ABNORMAL LOW (ref 135–145)

## 2020-12-16 LAB — CBC WITH DIFFERENTIAL/PLATELET
Abs Immature Granulocytes: 0.01 10*3/uL (ref 0.00–0.07)
Basophils Absolute: 0.1 10*3/uL (ref 0.0–0.1)
Basophils Relative: 1 %
Eosinophils Absolute: 0.1 10*3/uL (ref 0.0–0.5)
Eosinophils Relative: 2 %
HCT: 28.6 % — ABNORMAL LOW (ref 36.0–46.0)
Hemoglobin: 8.3 g/dL — ABNORMAL LOW (ref 12.0–15.0)
Immature Granulocytes: 0 %
Lymphocytes Relative: 23 %
Lymphs Abs: 1.4 10*3/uL (ref 0.7–4.0)
MCH: 22.8 pg — ABNORMAL LOW (ref 26.0–34.0)
MCHC: 29 g/dL — ABNORMAL LOW (ref 30.0–36.0)
MCV: 78.6 fL — ABNORMAL LOW (ref 80.0–100.0)
Monocytes Absolute: 0.3 10*3/uL (ref 0.1–1.0)
Monocytes Relative: 6 %
Neutro Abs: 4.2 10*3/uL (ref 1.7–7.7)
Neutrophils Relative %: 68 %
Platelets: 344 10*3/uL (ref 150–400)
RBC: 3.64 MIL/uL — ABNORMAL LOW (ref 3.87–5.11)
RDW: 21 % — ABNORMAL HIGH (ref 11.5–15.5)
WBC: 6.2 10*3/uL (ref 4.0–10.5)
nRBC: 0 % (ref 0.0–0.2)

## 2020-12-16 LAB — RETICULOCYTES
Immature Retic Fract: 22.9 % — ABNORMAL HIGH (ref 2.3–15.9)
RBC.: 3.62 MIL/uL — ABNORMAL LOW (ref 3.87–5.11)
Retic Count, Absolute: 48.1 10*3/uL (ref 19.0–186.0)
Retic Ct Pct: 1.3 % (ref 0.4–3.1)

## 2020-12-16 LAB — IRON AND TIBC
Iron: 40 ug/dL (ref 28–170)
Saturation Ratios: 13 % (ref 10.4–31.8)
TIBC: 301 ug/dL (ref 250–450)
UIBC: 261 ug/dL

## 2020-12-16 LAB — LACTATE DEHYDROGENASE: LDH: 133 U/L (ref 98–192)

## 2020-12-16 LAB — VITAMIN B12: Vitamin B-12: 394 pg/mL (ref 180–914)

## 2020-12-16 NOTE — Progress Notes (Signed)
[name] NOTE  Patient Care Team: Elgie Collard, MD as PCP - General (Obstetrics and Gynecology)  CHIEF COMPLAINTS/PURPOSE OF CONSULTATION: ANEMIA   HEMATOLOGY HISTORY:  # ANEMIA  colonoscopy; gastric by pass [2119]; 2020- [facility] IV venofer; colo- 2017 [Dr.Wohl]  #Gastric bypass  [[facility]; 2004]  HISTORY OF PRESENTING ILLNESS: Alone.  Ambulating independently. Caitlin Salazar 55 y.o.  female has been referred to [place] for further evaluation/work-up for anemia.  Patient has a prior history of bariatric surgery in 2004.  She has received IV iron infusions in [facility] approximately a year ago.  Patient is extremely tired.  Blood in stools: None Change in bowel habits- constipation Blood in urine: None Difficulty swallowing: None Abnormal weight loss: None Iron supplementation: Poor tolerance/not helping. Prior Blood transfusions: none Bariatric surgery: yes EGD: ? Bastric surgery.  Vaginal bleeding: None  Review of Systems  Constitutional:  Positive for malaise/fatigue. Negative for chills, diaphoresis, fever and weight loss.  HENT:  Negative for nosebleeds and sore throat.   Eyes:  Negative for double vision.  Respiratory:  Negative for cough, hemoptysis, sputum production, shortness of breath and wheezing.   Cardiovascular:  Negative for chest pain, palpitations, orthopnea and leg swelling.  Gastrointestinal:  Negative for abdominal pain, blood in stool, constipation, diarrhea, heartburn, melena, nausea and vomiting.  Genitourinary:  Negative for dysuria, frequency and urgency.  Musculoskeletal:  Positive for joint pain, myalgias and neck pain. Negative for back pain.  Skin: Negative.  Negative for itching and rash.  Neurological:  Positive for dizziness. Negative for tingling, focal weakness, weakness and headaches.  Endo/Heme/Allergies:  Does not bruise/bleed easily.  Psychiatric/Behavioral:  Negative for depression. The patient is not  nervous/anxious and does not have insomnia.    MEDICAL HISTORY:  Past Medical History:  Diagnosis Date   Allergic rhinitis    Arthritis    left knee   Asthma    when younger   Environmental allergies    Fatigue    Hypertension    Lack of appetite    Menorrhagia    Vitamin D deficiency     SURGICAL HISTORY: Past Surgical History:  Procedure Laterality Date   BREAST BIOPSY Left 01/06/2018   bx/clip/ neg   CESAREAN SECTION     COLONOSCOPY WITH PROPOFOL N/A 04/11/2015   Procedure: COLONOSCOPY WITH PROPOFOL;  Surgeon: Lucilla Lame, MD;  Location: [place] [facility];  Service: Endoscopy;  Laterality: N/A;  PLEASE LEAVE PT START TIME FOR 930   GASTRIC BYPASS     POLYPECTOMY  04/11/2015   Procedure: POLYPECTOMY;  Surgeon: Lucilla Lame, MD;  Location: [facility];  Service: Endoscopy;;    SOCIAL HISTORY: Social History   Socioeconomic History   Marital status: Legally Separated    Spouse name: Not on file   Number of children: Not on file   Years of education: Not on file   Highest education level: Not on file  Occupational History   Not on file  Tobacco Use   Smoking status: Never   Smokeless tobacco: Never  Vaping Use   Vaping Use: Never used  Substance and Sexual Activity   Alcohol use: Yes    Comment: last use 08/24/20 1x/mo   Drug use: Not Currently    Types: Marijuana    Comment: last use age 57   Sexual activity: Yes    Partners: Male  Other Topics Concern   Not on file  Social History Narrative   Comptroller; 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    Social Determinants of Health   Financial Resource Strain: Not on file  Food Insecurity: Not on file  Transportation Needs: Not on file  Physical Activity: Not on file  Stress: Not on file  Social Connections: Not on file  Intimate Partner Violence: Not on file    FAMILY HISTORY: Family History  Problem Relation Age of Onset   Diabetes Father     Breast cancer Maternal Aunt    Breast cancer Cousin 64       mat cousin    ALLERGIES:  is allergic to penicillins.  MEDICATIONS:  Current Outpatient Medications  Medication Sig Dispense Refill   Cholecalciferol (VITAMIN D3) 1.25 MG (50000 UT) CAPS Take 1 capsule by mouth once a week.     fluticasone (FLONASE) 50 MCG/ACT nasal spray Place 2 sprays into both nostrils daily. 16 g 5   levocetirizine (XYZAL) 5 MG tablet Take 5 mg by mouth daily. AM     lisinopril-hydrochlorothiazide (ZESTORETIC) 10-12.5 MG tablet Take 1 tablet by mouth daily.     meloxicam (MOBIC) 15 MG tablet Take 15 mg by mouth daily.     oxybutynin (DITROPAN) 5 MG tablet Take 5 mg by mouth 2 (two) times daily.     phentermine 30 MG capsule Take by mouth as needed. Reported on 04/11/2015     traZODone (DESYREL) 50 MG tablet Take 50 mg by mouth at bedtime.     albuterol (VENTOLIN HFA) 108 (90 Base) MCG/ACT inhaler Inhale into the lungs. Reported on 04/11/2015 (Patient not taking: Reported on 12/16/2020)     QVAR REDIHALER 40 MCG/ACT inhaler Inhale 2 puffs into the lungs in the morning and at bedtime. (Patient not taking: Reported on 12/16/2020)     No current facility-administered medications for this visit.      PHYSICAL EXAMINATION:   Vitals:   12/16/20 1426  BP: (!) 109/52  Pulse: 78  Resp: 18  Temp: 98 F (36.7 C)  SpO2: 100%   Filed Weights   12/16/20 1426  Weight: 273 lb (123.8 kg)    Physical Exam Vitals and nursing note reviewed.  HENT:     Head: Normocephalic and atraumatic.     Mouth/Throat:     Pharynx: Oropharynx is clear.  Eyes:     Extraocular Movements: Extraocular movements intact.     Pupils: Pupils are equal, round, and reactive to light.  Cardiovascular:     Rate and Rhythm: Normal rate and regular rhythm.  Pulmonary:     Comments: Decreased breath sounds bilaterally.  Abdominal:     Palpations: Abdomen is soft.  Musculoskeletal:        General: Normal range of motion.      Cervical back: Normal range of motion.  Skin:    General: Skin is warm.  Neurological:     General: No focal deficit present.     Mental Status: She is alert and oriented to person, place, and time.  Psychiatric:        Behavior: Behavior normal.        Judgment: Judgment normal.   LABORATORY DATA:  I have reviewed the data as listed Lab Results  Component Value Date   WBC 6.2 12/16/2020   HGB 8.3 (L) 12/16/2020   HCT 28.6 (L) 12/16/2020   MCV 78.6 (L) 12/16/2020   PLT 344 12/16/2020   Recent Labs    12/16/20 1507  NA 133*  K 5.3*  CL 105  CO2 21*  GLUCOSE 106*  BUN 33*  CREATININE 1.51*  CALCIUM 9.3  GFRNONAA 40*     No results found.  Symptomatic anemia # Anemia-hemoglobin 7.5 [PCP-labs/scanned] suspect -ion deficiency/history of bariatric surgery.    # Patient is symptomatic from worsening fatigue-recommend IV iron infusions. Discussed the potential acute infusion reactions with IV iron; which are quite rare.  Patient understands the risk; will proceed with infusions.  Patient had previous infusions with [facility].  #Etiology: Likely secondary bariatric surgery; colonoscopy 2017-Dr. Wohl.   # DISPOSITION: # labs today- cbc/bmp; iron studies/ferritin/B12 # Venofer weekly x 4  # follow up in 2 months- NP; labs- cbc; possible venofer- Dr.B  Thank you,[name] for allowing me to participate in the care of your pleasant patient. Please do not hesitate to contact me with questions or concerns in the interim.     All questions were answered. The patient knows to call the clinic with any problems, questions or concerns.      Cammie Sickle, MD 12/16/2020 4:26 PM

## 2020-12-16 NOTE — Assessment & Plan Note (Addendum)
#   Anemia-hemoglobin 7.5 [PCP-labs/scanned] suspect -ion deficiency/history of bariatric surgery.    # Patient is symptomatic from worsening fatigue-recommend IV iron infusions. Discussed the potential acute infusion reactions with IV iron; which are quite rare.  Patient understands the risk; will proceed with infusions.  Patient had previous infusions with [facility].  #Etiology: Likely secondary bariatric surgery; colonoscopy 2017-Dr. Wohl.   # DISPOSITION: # labs today- cbc/bmp; iron studies/ferritin/B12 # Venofer weekly x 4  # follow up in 2 months- NP; labs- cbc; possible venofer- Dr.B  Thank you,[name] for allowing me to participate in the care of your pleasant patient. Please do not hesitate to contact me with questions or concerns in the interim.

## 2020-12-16 NOTE — Progress Notes (Signed)
Patient reports lack of appetite, trouble sleeping, and constipation.

## 2020-12-23 ENCOUNTER — Other Ambulatory Visit: Payer: Self-pay

## 2020-12-23 ENCOUNTER — Inpatient Hospital Stay: Payer: BC Managed Care – PPO

## 2020-12-23 VITALS — BP 132/67 | HR 61 | Temp 96.0°F | Resp 18

## 2020-12-23 DIAGNOSIS — D649 Anemia, unspecified: Secondary | ICD-10-CM

## 2020-12-23 MED ORDER — IRON SUCROSE 20 MG/ML IV SOLN
200.0000 mg | Freq: Once | INTRAVENOUS | Status: AC
  Administered 2020-12-23: 200 mg via INTRAVENOUS
  Filled 2020-12-23: qty 10

## 2020-12-23 MED ORDER — SODIUM CHLORIDE 0.9 % IV SOLN: 200.0000 mg | Freq: Once | INTRAVENOUS | Status: DC

## 2020-12-23 MED ORDER — SODIUM CHLORIDE 0.9 % IV SOLN
Freq: Once | INTRAVENOUS | Status: AC
  Filled 2020-12-23: qty 250

## 2020-12-23 NOTE — Patient Instructions (Signed)

## 2020-12-30 ENCOUNTER — Other Ambulatory Visit: Payer: Self-pay

## 2020-12-30 ENCOUNTER — Inpatient Hospital Stay: Payer: BC Managed Care – PPO

## 2020-12-30 VITALS — BP 132/65 | HR 55 | Resp 18

## 2020-12-30 DIAGNOSIS — D649 Anemia, unspecified: Secondary | ICD-10-CM | POA: Diagnosis not present

## 2020-12-30 MED ORDER — SODIUM CHLORIDE 0.9 % IV SOLN: 200.0000 mg | Freq: Once | INTRAVENOUS | Status: DC

## 2020-12-30 MED ORDER — SODIUM CHLORIDE 0.9 % IV SOLN
Freq: Once | INTRAVENOUS | Status: AC
  Filled 2020-12-30: qty 250

## 2020-12-30 MED ORDER — IRON SUCROSE 20 MG/ML IV SOLN
200.0000 mg | Freq: Once | INTRAVENOUS | Status: AC
  Administered 2020-12-30: 200 mg via INTRAVENOUS
  Filled 2020-12-30: qty 10

## 2020-12-30 NOTE — Patient Instructions (Signed)
[facility]  Discharge Instructions: Thank you for choosing [facility] to provide your oncology and hematology care.  If you have a lab appointment with the Cancer Center, please go directly to the Cancer Center and check in at the registration area.  Wear comfortable clothing and clothing appropriate for easy access to any Portacath or PICC line.   We strive to give you quality time with your provider. You may need to reschedule your appointment if you arrive late (15 or more minutes).  Arriving late affects you and other patients whose appointments are after yours.  Also, if you miss three or more appointments without notifying the office, you may be dismissed from the clinic at the provider's discretion.      For prescription refill requests, have your pharmacy contact our office and allow 72 hours for refills to be completed.    Today you received the following : Venofer   To help prevent nausea and vomiting after your treatment, we encourage you to take your nausea medication as directed.  BELOW ARE SYMPTOMS THAT SHOULD BE REPORTED IMMEDIATELY: . *FEVER GREATER THAN 100.4 F (38 C) OR HIGHER . *CHILLS OR SWEATING . *NAUSEA AND VOMITING THAT IS NOT CONTROLLED WITH YOUR NAUSEA MEDICATION . *UNUSUAL SHORTNESS OF BREATH . *UNUSUAL BRUISING OR BLEEDING . *URINARY PROBLEMS (pain or burning when urinating, or frequent urination) . *BOWEL PROBLEMS (unusual diarrhea, constipation, pain near the anus) . TENDERNESS IN MOUTH AND THROAT WITH OR WITHOUT PRESENCE OF ULCERS (sore throat, sores in mouth, or a toothache) . UNUSUAL RASH, SWELLING OR PAIN  . UNUSUAL VAGINAL DISCHARGE OR ITCHING   Items with * indicate a potential emergency and should be followed up as soon as possible or go to the Emergency Department if any problems should occur.  Please show the CHEMOTHERAPY ALERT CARD or IMMUNOTHERAPY ALERT CARD at check-in to the Emergency  Department and triage nurse.  Should you have questions after your visit or need to cancel or reschedule your appointment, please contact [facility] CENTER [place]  [phone] and follow the prompts.  Office hours are 8:00 a.m. to 4:30 p.m. Monday - Friday. Please note that voicemails left after 4:00 p.m. may not be returned until the following business day.  We are closed weekends and major holidays. You have access to a nurse at all times for urgent questions. Please call the main number to the clinic [phone] and follow the prompts.  For any non-urgent questions, you may also contact your provider using MyChart. We now offer e-Visits for anyone 18 and older to request care online for non-urgent symptoms. For details visit [email or web address].   Also download the MyChart app! Go to the app store, search "MyChart", open the app, select [facility], and log in with your MyChart username and password.  Due to Covid, a mask is required upon entering the hospital/clinic. If you do not have a mask, one will be given to you upon arrival. For doctor visits, patients may have 1 support person aged 18 or older with them. For treatment visits, patients cannot have anyone with them due to current Covid guidelines and our immunocompromised population.  

## 2021-01-06 ENCOUNTER — Inpatient Hospital Stay: Payer: BC Managed Care – PPO

## 2021-01-06 ENCOUNTER — Other Ambulatory Visit: Payer: Self-pay

## 2021-01-06 VITALS — BP 121/58 | HR 70 | Temp 97.0°F | Resp 18

## 2021-01-06 DIAGNOSIS — D649 Anemia, unspecified: Secondary | ICD-10-CM | POA: Diagnosis not present

## 2021-01-06 MED ORDER — SODIUM CHLORIDE 0.9 % IV SOLN: 200.0000 mg | Freq: Once | INTRAVENOUS | Status: DC

## 2021-01-06 MED ORDER — SODIUM CHLORIDE 0.9 % IV SOLN
Freq: Once | INTRAVENOUS | Status: AC
  Filled 2021-01-06: qty 250

## 2021-01-06 MED ORDER — IRON SUCROSE 20 MG/ML IV SOLN
200.0000 mg | Freq: Once | INTRAVENOUS | Status: AC
Start: 2021-01-06 — End: 2021-01-06
  Administered 2021-01-06: 200 mg via INTRAVENOUS
  Filled 2021-01-06: qty 10

## 2021-01-06 NOTE — Patient Instructions (Signed)

## 2021-01-13 ENCOUNTER — Inpatient Hospital Stay: Payer: BC Managed Care – PPO

## 2021-01-14 ENCOUNTER — Inpatient Hospital Stay: Payer: BC Managed Care – PPO | Attending: Nurse Practitioner

## 2021-01-14 ENCOUNTER — Other Ambulatory Visit: Payer: Self-pay

## 2021-01-14 ENCOUNTER — Other Ambulatory Visit: Payer: Self-pay | Admitting: Nephrology

## 2021-01-14 VITALS — BP 114/44 | HR 74 | Temp 97.0°F | Resp 20

## 2021-01-14 DIAGNOSIS — D649 Anemia, unspecified: Secondary | ICD-10-CM | POA: Diagnosis present

## 2021-01-14 DIAGNOSIS — Z9884 Bariatric surgery status: Secondary | ICD-10-CM | POA: Insufficient documentation

## 2021-01-14 DIAGNOSIS — U071 COVID-19: Secondary | ICD-10-CM | POA: Insufficient documentation

## 2021-01-14 DIAGNOSIS — E663 Overweight: Secondary | ICD-10-CM | POA: Insufficient documentation

## 2021-01-14 DIAGNOSIS — R609 Edema, unspecified: Secondary | ICD-10-CM

## 2021-01-14 DIAGNOSIS — N1831 Chronic kidney disease, stage 3a: Secondary | ICD-10-CM | POA: Insufficient documentation

## 2021-01-14 MED ORDER — SODIUM CHLORIDE 0.9 % IV SOLN: 200.0000 mg | Freq: Once | INTRAVENOUS | Status: DC

## 2021-01-14 MED ORDER — SODIUM CHLORIDE 0.9 % IV SOLN
Freq: Once | INTRAVENOUS | Status: AC
Start: 2021-01-14 — End: 2021-01-14
  Filled 2021-01-14: qty 250

## 2021-01-14 MED ORDER — IRON SUCROSE 20 MG/ML IV SOLN
200.0000 mg | Freq: Once | INTRAVENOUS | Status: AC
  Administered 2021-01-14: 200 mg via INTRAVENOUS
  Filled 2021-01-14: qty 10

## 2021-01-14 NOTE — Patient Instructions (Signed)

## 2021-01-20 ENCOUNTER — Other Ambulatory Visit: Payer: Self-pay | Admitting: Obstetrics and Gynecology

## 2021-01-20 DIAGNOSIS — Z1231 Encounter for screening mammogram for malignant neoplasm of breast: Secondary | ICD-10-CM

## 2021-02-17 ENCOUNTER — Other Ambulatory Visit: Payer: Self-pay

## 2021-02-17 ENCOUNTER — Inpatient Hospital Stay: Payer: BC Managed Care – PPO

## 2021-02-17 ENCOUNTER — Encounter: Payer: Self-pay | Admitting: Nurse Practitioner

## 2021-02-17 ENCOUNTER — Inpatient Hospital Stay: Payer: BC Managed Care – PPO | Attending: Nurse Practitioner

## 2021-02-17 ENCOUNTER — Inpatient Hospital Stay (HOSPITAL_BASED_OUTPATIENT_CLINIC_OR_DEPARTMENT_OTHER): Payer: BC Managed Care – PPO | Admitting: Nurse Practitioner

## 2021-02-17 VITALS — BP 122/60 | HR 55

## 2021-02-17 VITALS — BP 120/50 | HR 52 | Temp 97.6°F | Resp 18 | Wt 275.4 lb

## 2021-02-17 DIAGNOSIS — D538 Other specified nutritional anemias: Secondary | ICD-10-CM | POA: Insufficient documentation

## 2021-02-17 DIAGNOSIS — D649 Anemia, unspecified: Secondary | ICD-10-CM

## 2021-02-17 DIAGNOSIS — Z9884 Bariatric surgery status: Secondary | ICD-10-CM | POA: Diagnosis not present

## 2021-02-17 DIAGNOSIS — D509 Iron deficiency anemia, unspecified: Secondary | ICD-10-CM | POA: Insufficient documentation

## 2021-02-17 DIAGNOSIS — E538 Deficiency of other specified B group vitamins: Secondary | ICD-10-CM

## 2021-02-17 DIAGNOSIS — Z79899 Other long term (current) drug therapy: Secondary | ICD-10-CM | POA: Diagnosis not present

## 2021-02-17 LAB — CBC WITH DIFFERENTIAL/PLATELET
Abs Immature Granulocytes: 0.02 10*3/uL (ref 0.00–0.07)
Basophils Absolute: 0 10*3/uL (ref 0.0–0.1)
Basophils Relative: 1 %
Eosinophils Absolute: 0.2 10*3/uL (ref 0.0–0.5)
Eosinophils Relative: 2 %
HCT: 33.8 % — ABNORMAL LOW (ref 36.0–46.0)
Hemoglobin: 10 g/dL — ABNORMAL LOW (ref 12.0–15.0)
Immature Granulocytes: 0 %
Lymphocytes Relative: 22 %
Lymphs Abs: 1.6 10*3/uL (ref 0.7–4.0)
MCH: 26 pg (ref 26.0–34.0)
MCHC: 29.6 g/dL — ABNORMAL LOW (ref 30.0–36.0)
MCV: 87.8 fL (ref 80.0–100.0)
Monocytes Absolute: 0.5 10*3/uL (ref 0.1–1.0)
Monocytes Relative: 6 %
Neutro Abs: 5.1 10*3/uL (ref 1.7–7.7)
Neutrophils Relative %: 69 %
Platelets: 264 10*3/uL (ref 150–400)
RBC: 3.85 MIL/uL — ABNORMAL LOW (ref 3.87–5.11)
RDW: 22.8 % — ABNORMAL HIGH (ref 11.5–15.5)
WBC: 7.4 10*3/uL (ref 4.0–10.5)
nRBC: 0 % (ref 0.0–0.2)

## 2021-02-17 LAB — FOLATE: Folate: 21.2 ng/mL (ref >5.9)

## 2021-02-17 LAB — IRON AND TIBC
Iron: 49 ug/dL (ref 28–170)
Saturation Ratios: 19 % (ref 10.4–31.8)
TIBC: 255 ug/dL (ref 250–450)
UIBC: 206 ug/dL

## 2021-02-17 LAB — FERRITIN: Ferritin: 51 ng/mL (ref 11–307)

## 2021-02-17 MED ORDER — CYANOCOBALAMIN 1000 MCG/ML IJ SOLN
1000.0000 ug | Freq: Once | INTRAMUSCULAR | Status: AC
  Administered 2021-02-17: 1000 ug via INTRAMUSCULAR
  Filled 2021-02-17: qty 1

## 2021-02-17 MED ORDER — SODIUM CHLORIDE 0.9 % IV SOLN: 200.0000 mg | Freq: Once | INTRAVENOUS | Status: DC

## 2021-02-17 MED ORDER — IRON SUCROSE 20 MG/ML IV SOLN
200.0000 mg | Freq: Once | INTRAVENOUS | Status: AC
  Administered 2021-02-17: 200 mg via INTRAVENOUS
  Filled 2021-02-17: qty 10

## 2021-02-17 MED ORDER — SODIUM CHLORIDE 0.9 % IV SOLN
Freq: Once | INTRAVENOUS | Status: AC
  Filled 2021-02-17: qty 250

## 2021-02-17 NOTE — Progress Notes (Signed)
[facility] CONSULT NOTE  Virtual Visit Progress Note  I connected with Caitlin Salazar on 02/18/21 at  2:20 PM EST by video enabled telemedicine visit and verified that I am speaking with the correct person using two identifiers.   I discussed the limitations, risks, security and privacy concerns of performing an evaluation and management service by telemedicine and the availability of in-person appointments. I also discussed with the patient that there may be a patient responsible charge related to this service. The patient expressed understanding and agreed to proceed.   Other persons participating in the visit and their role in the encounter: Jonnie Finner, RN   Patient's location: clinic  Provider's location: home   Patient Care Team: Elgie Collard, MD as PCP - General (Obstetrics and Gynecology)  CHIEF COMPLAINTS/PURPOSE OF CONSULTATION: ANEMIA  HEMATOLOGY HISTORY:  # ANEMIA  colonoscopy; gastric by pass [2004]; 2020- [facility] IV venofer; colo- 2017 [Dr.Wohl]  #Gastric bypass  [[facility]; 2004]  HISTORY OF PRESENTING ILLNESS: Alone.  Ambulating independently. Caitlin Salazar 55 y.o. female who returns to clinic for labs, further evaluation, and consideration of iv iron and b12. She is not able to tolerate oral iron. Prior history of gastric bypass. Not on oral b12. Takes a multivitamin. Fatigue has improved but not yet at her baseline.   Review of Systems  Constitutional:  Positive for malaise/fatigue. Negative for chills, fever and weight loss.  HENT:  Negative for hearing loss, nosebleeds, sore throat and tinnitus.   Eyes:  Negative for blurred vision and double vision.  Respiratory:  Negative for cough, hemoptysis, shortness of breath and wheezing.   Cardiovascular:  Negative for chest pain, palpitations and leg swelling.  Gastrointestinal:  Negative for abdominal pain, blood in stool, constipation, diarrhea, melena, nausea and vomiting.   Genitourinary:  Negative for dysuria and urgency.  Musculoskeletal:  Negative for back pain, falls, joint pain and myalgias.  Skin:  Negative for itching and rash.  Neurological:  Negative for dizziness, tingling, sensory change, loss of consciousness, weakness and headaches.  Endo/Heme/Allergies:  Negative for environmental allergies. Does not bruise/bleed easily.  Psychiatric/Behavioral:  Negative for depression. The patient is not nervous/anxious and does not have insomnia.    MEDICAL HISTORY:  Past Medical History:  Diagnosis Date   Allergic rhinitis    Arthritis    left knee   Asthma    when younger   Environmental allergies    Fatigue    Hypertension    Lack of appetite    Menorrhagia    Vitamin D deficiency     SURGICAL HISTORY: Past Surgical History:  Procedure Laterality Date   BREAST BIOPSY Left 01/06/2018   bx/clip/ neg   CESAREAN SECTION     COLONOSCOPY WITH PROPOFOL N/A 04/11/2015   Procedure: COLONOSCOPY WITH PROPOFOL;  Surgeon: Lucilla Lame, MD;  Location: [facility];  Service: Endoscopy;  Laterality: N/A;  PLEASE LEAVE PT START TIME FOR 930   GASTRIC BYPASS     POLYPECTOMY  04/11/2015   Procedure: POLYPECTOMY;  Surgeon: Lucilla Lame, MD;  Location: [facility];  Service: Endoscopy;;    SOCIAL HISTORY: Social History   Socioeconomic History   Marital status: Legally Separated    Spouse name: Not on file   Number of children: Not on file   Years of education: Not on file   Highest education level: Not on file  Occupational History   Not on file  Tobacco Use   Smoking status: Never  Smokeless tobacco: Never  Vaping Use   Vaping Use: Never used  Substance and Sexual Activity   Alcohol use: Yes    Comment: last use 08/24/20 1x/mo   Drug use: Not Currently    Types: Marijuana    Comment: last use age 52   Sexual activity: Yes    Partners: Male  Other Topics Concern   Not on file  Social History Narrative   Comptroller;  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    Social Determinants of Health   Financial Resource Strain: Not on file  Food Insecurity: Not on file  Transportation Needs: Not on file  Physical Activity: Not on file  Stress: Not on file  Social Connections: Not on file  Intimate Partner Violence: Not on file    FAMILY HISTORY: Family History  Problem Relation Age of Onset   Diabetes Father    Breast cancer Maternal Aunt    Breast cancer Cousin 19       mat cousin    ALLERGIES:  is allergic to penicillins.  MEDICATIONS:  Current Outpatient Medications  Medication Sig Dispense Refill   Cholecalciferol (VITAMIN D3) 1.25 MG (50000 UT) CAPS Take 1 capsule by mouth once a week.     fluticasone (FLONASE) 50 MCG/ACT nasal spray Place 2 sprays into both nostrils daily. 16 g 5   levocetirizine (XYZAL) 5 MG tablet Take 5 mg by mouth daily. AM     lisinopril-hydrochlorothiazide (ZESTORETIC) 10-12.5 MG tablet Take 1 tablet by mouth daily.     meloxicam (MOBIC) 15 MG tablet Take 15 mg by mouth daily.     oxybutynin (DITROPAN) 5 MG tablet Take 5 mg by mouth 2 (two) times daily.     QVAR REDIHALER 40 MCG/ACT inhaler Inhale 2 puffs into the lungs in the morning and at bedtime.     traZODone (DESYREL) 50 MG tablet Take 50 mg by mouth at bedtime.     albuterol (VENTOLIN HFA) 108 (90 Base) MCG/ACT inhaler Inhale into the lungs. Reported on 04/11/2015 (Patient not taking: Reported on 12/16/2020)     phentermine 30 MG capsule Take by mouth as needed. Reported on 04/11/2015 (Patient not taking: Reported on 02/17/2021)     No current facility-administered medications for this visit.      PHYSICAL EXAMINATION:   Vitals:   02/17/21 1420  BP: (!) 120/50  Pulse: (!) 52  Resp: 18  Temp: 97.6 F (36.4 C)  SpO2: 100%   Filed Weights   02/17/21 1420  Weight: 275 lb 6.4 oz (124.9 kg)    Physical Exam Constitutional:      Appearance: She is obese. She is  not ill-appearing.  Pulmonary:     Effort: Pulmonary effort is normal.  Neurological:     Mental Status: She is alert and oriented to person, place, and time.  Psychiatric:        Mood and Affect: Mood normal.        Behavior: Behavior normal.   LABORATORY DATA:  I have reviewed the data as listed Lab Results  Component Value Date   WBC 7.4 02/17/2021   HGB 10.0 (L) 02/17/2021   HCT 33.8 (L) 02/17/2021   MCV 87.8 02/17/2021   PLT 264 02/17/2021   Recent Labs    12/16/20 1507  NA 133*  K 5.3*  CL 105  CO2 21*  GLUCOSE 106*  BUN 33*  CREATININE 1.51*  CALCIUM 9.3  GFRNONAA 40*  Iron/TIBC/Ferritin/ %Sat    Component Value Date/Time   IRON 49 02/17/2021 1358   TIBC 255 02/17/2021 1358   FERRITIN 51 02/17/2021 1358   IRONPCTSAT 19 02/17/2021 1358    No results found.  No problem-specific Assessment & Plan notes found for this encounter.  Iron Deficiency Anemia- s/p venofer x 4. Hemoglobin improved to 10 (previously 8.3). Microcytosis has resolved but persistent hypochromasia (mild). Ferritin and iron studies are pending at time of visit. She has persistent but improved symptoms. Proceed with venofer 200 mg today. Ferritin has improved and now is 51, saturation ratio now 19%. Hold additional IV iron and recommend monitoring.   B12 Deficiency- b12 was 394 on 12/16/20. Not currently on oral or IM supplementation. In setting of gastric bypass I doubt she'll absorb oral b12. Proceed with B12 injection today and plan for monthly b12. She would like to give herself b12 injections at home and I will send prescription today. Will recheck b12 at next visit. Folate normal.   Disposition:  Venofer & b12 today 3 months- labs (cbc, ferritin, iron studies, b12). Day to week later see Dr. Rogue Bussing +/- venofer. Visits can be virtual if patient prefers.    I discussed the assessment and treatment plan with the patient. The patient was provided an opportunity to ask questions and all  were answered. The patient agreed with the plan and demonstrated an understanding of the instructions.   The patient was advised to call back or seek an in-person evaluation if the symptoms worsen or if the condition fails to improve as anticipated.   I spent 20 minutes face-to-face video visit time dedicated to the care of this patient on the date of this encounter to include pre-visit review of hematology notes, labs, face-to-face time with the patient, and post visit ordering of testing/documentation.    Verlon Au, NP 02/17/2021

## 2021-02-17 NOTE — Progress Notes (Signed)
Pt feeling so much better. Energy is good. Hasn't started on B12 yet. MD was supposed to ordere it for her.

## 2021-02-18 ENCOUNTER — Encounter: Payer: Self-pay | Admitting: Internal Medicine

## 2021-02-18 MED ORDER — SYRINGE/NEEDLE (DISP) 21G X 1" 3 ML MISC
0 refills | Status: DC
Start: 2021-02-18 — End: 2022-08-10

## 2021-02-18 MED ORDER — CYANOCOBALAMIN 1000 MCG/ML IJ SOLN: 1000.0000 ug | Freq: Once | INTRAMUSCULAR | 0 refills | Status: AC

## 2021-02-23 ENCOUNTER — Other Ambulatory Visit: Payer: Self-pay

## 2021-02-23 ENCOUNTER — Ambulatory Visit
Admission: RE | Admit: 2021-02-23 | Discharge: 2021-02-23 | Disposition: A | Discharge status: Home / Self Care | Payer: BC Managed Care – PPO | Source: Ambulatory Visit | Attending: Obstetrics and Gynecology | Admitting: Obstetrics and Gynecology

## 2021-02-23 DIAGNOSIS — Z1231 Encounter for screening mammogram for malignant neoplasm of breast: Secondary | ICD-10-CM | POA: Diagnosis present

## 2021-04-08 ENCOUNTER — Other Ambulatory Visit: Payer: Self-pay | Admitting: Nephrology

## 2021-04-08 DIAGNOSIS — R609 Edema, unspecified: Secondary | ICD-10-CM

## 2021-04-08 DIAGNOSIS — N1831 Chronic kidney disease, stage 3a: Secondary | ICD-10-CM

## 2021-04-08 DIAGNOSIS — Z9884 Bariatric surgery status: Secondary | ICD-10-CM

## 2021-04-08 DIAGNOSIS — U071 COVID-19: Secondary | ICD-10-CM

## 2021-04-08 DIAGNOSIS — D649 Anemia, unspecified: Secondary | ICD-10-CM

## 2021-05-06 ENCOUNTER — Other Ambulatory Visit: Payer: Self-pay | Admitting: *Deleted

## 2021-05-06 DIAGNOSIS — D509 Iron deficiency anemia, unspecified: Secondary | ICD-10-CM

## 2021-05-12 ENCOUNTER — Other Ambulatory Visit: Payer: Self-pay

## 2021-05-12 ENCOUNTER — Telehealth: Payer: Self-pay | Admitting: Internal Medicine

## 2021-05-12 ENCOUNTER — Inpatient Hospital Stay: Payer: BC Managed Care – PPO | Attending: Internal Medicine | Admitting: Nurse Practitioner

## 2021-05-12 DIAGNOSIS — D509 Iron deficiency anemia, unspecified: Secondary | ICD-10-CM

## 2021-05-12 DIAGNOSIS — D538 Other specified nutritional anemias: Secondary | ICD-10-CM | POA: Insufficient documentation

## 2021-05-12 DIAGNOSIS — E538 Deficiency of other specified B group vitamins: Secondary | ICD-10-CM

## 2021-05-12 DIAGNOSIS — D508 Other iron deficiency anemias: Secondary | ICD-10-CM | POA: Diagnosis not present

## 2021-05-12 DIAGNOSIS — Z9884 Bariatric surgery status: Secondary | ICD-10-CM | POA: Insufficient documentation

## 2021-05-12 LAB — CBC WITH DIFFERENTIAL/PLATELET
Abs Immature Granulocytes: 0.02 10*3/uL (ref 0.00–0.07)
Basophils Absolute: 0 10*3/uL (ref 0.0–0.1)
Basophils Relative: 0 %
Eosinophils Absolute: 0.2 10*3/uL (ref 0.0–0.5)
Eosinophils Relative: 2 %
HCT: 35.5 % — ABNORMAL LOW (ref 36.0–46.0)
Hemoglobin: 11.3 g/dL — ABNORMAL LOW (ref 12.0–15.0)
Immature Granulocytes: 0 %
Lymphocytes Relative: 24 %
Lymphs Abs: 1.7 10*3/uL (ref 0.7–4.0)
MCH: 27.9 pg (ref 26.0–34.0)
MCHC: 31.8 g/dL (ref 30.0–36.0)
MCV: 87.7 fL (ref 80.0–100.0)
Monocytes Absolute: 0.5 10*3/uL (ref 0.1–1.0)
Monocytes Relative: 7 %
Neutro Abs: 4.7 10*3/uL (ref 1.7–7.7)
Neutrophils Relative %: 67 %
Platelets: 248 10*3/uL (ref 150–400)
RBC: 4.05 MIL/uL (ref 3.87–5.11)
RDW: 13.7 % (ref 11.5–15.5)
WBC: 7.1 10*3/uL (ref 4.0–10.5)
nRBC: 0 % (ref 0.0–0.2)

## 2021-05-12 LAB — FERRITIN: Ferritin: 42 ng/mL (ref 11–307)

## 2021-05-12 LAB — IRON AND TIBC
Iron: 61 ug/dL (ref 28–170)
Saturation Ratios: 22 % (ref 10.4–31.8)
TIBC: 277 ug/dL (ref 250–450)
UIBC: 216 ug/dL

## 2021-05-12 LAB — VITAMIN B12: Vitamin B-12: 543 pg/mL (ref 180–914)

## 2021-05-12 NOTE — Telephone Encounter (Signed)
Return VM left by patient to confirm that she does have an appt today for lab only. Pt is not scheduled for MD and infusion until 3/7. Vm left with those details.

## 2021-05-15 ENCOUNTER — Encounter: Payer: Self-pay | Admitting: Internal Medicine

## 2021-05-18 ENCOUNTER — Ambulatory Visit
Admission: RE | Admit: 2021-05-18 | Discharge: 2021-05-18 | Disposition: A | Discharge status: Home / Self Care | Payer: BC Managed Care – PPO | Source: Ambulatory Visit | Attending: Nephrology | Admitting: Nephrology

## 2021-05-18 ENCOUNTER — Other Ambulatory Visit: Payer: BC Managed Care – PPO

## 2021-05-18 ENCOUNTER — Other Ambulatory Visit: Payer: Self-pay

## 2021-05-18 DIAGNOSIS — D649 Anemia, unspecified: Secondary | ICD-10-CM

## 2021-05-18 DIAGNOSIS — N1831 Chronic kidney disease, stage 3a: Secondary | ICD-10-CM | POA: Diagnosis present

## 2021-05-18 DIAGNOSIS — Z9884 Bariatric surgery status: Secondary | ICD-10-CM

## 2021-05-18 DIAGNOSIS — R609 Edema, unspecified: Secondary | ICD-10-CM | POA: Diagnosis not present

## 2021-05-18 DIAGNOSIS — U071 COVID-19: Secondary | ICD-10-CM | POA: Diagnosis present

## 2021-05-19 ENCOUNTER — Encounter: Payer: Self-pay | Admitting: Internal Medicine

## 2021-05-19 ENCOUNTER — Inpatient Hospital Stay: Payer: BC Managed Care – PPO

## 2021-05-19 ENCOUNTER — Inpatient Hospital Stay: Payer: BC Managed Care – PPO | Attending: Nurse Practitioner | Admitting: Internal Medicine

## 2021-05-19 VITALS — BP 123/67 | HR 64

## 2021-05-19 DIAGNOSIS — E538 Deficiency of other specified B group vitamins: Secondary | ICD-10-CM | POA: Insufficient documentation

## 2021-05-19 DIAGNOSIS — D509 Iron deficiency anemia, unspecified: Secondary | ICD-10-CM | POA: Diagnosis not present

## 2021-05-19 DIAGNOSIS — D649 Anemia, unspecified: Secondary | ICD-10-CM

## 2021-05-19 MED ORDER — IRON SUCROSE 20 MG/ML IV SOLN
200.0000 mg | Freq: Once | INTRAVENOUS | Status: AC
  Administered 2021-05-19: 200 mg via INTRAVENOUS
  Filled 2021-05-19: qty 10

## 2021-05-19 MED ORDER — SODIUM CHLORIDE 0.9 % IV SOLN
Freq: Once | INTRAVENOUS | Status: AC
  Filled 2021-05-19: qty 250

## 2021-05-19 MED ORDER — SODIUM CHLORIDE 0.9 % IV SOLN: 200.0000 mg | Freq: Once | INTRAVENOUS | Status: DC

## 2021-05-19 NOTE — Progress Notes (Signed)
[facility] CONSULT NOTE  Patient Care Team: Elgie Collard, MD as PCP - General (Obstetrics and Gynecology)  CHIEF COMPLAINTS/PURPOSE OF CONSULTATION: ANEMIA   HEMATOLOGY HISTORY:  # ANEMIA  colonoscopy; gastric by pass [2004]; 2020- [facility] IV venofer; colo- 2017 [Dr.Wohl]  #Gastric bypass  [[facility]; 2004]   Latest Reference Range & Units 05/12/21 15:30  Iron 28 - 170 ug/dL 61  UIBC ug/dL 216  TIBC 250 - 450 ug/dL 277  Saturation Ratios 10.4 - 31.8 % 22  Ferritin 11 - 307 ng/mL 42  Vitamin B12 180 - 914 pg/mL 543  WBC 4.0 - 10.5 K/uL 7.1  RBC 3.87 - 5.11 MIL/uL 4.05  Hemoglobin 12.0 - 15.0 g/dL 11.3 (L)  HCT 36.0 - 46.0 % 35.5 (L)  MCV 80.0 - 100.0 fL 87.7  MCH 26.0 - 34.0 pg 27.9  MCHC 30.0 - 36.0 g/dL 31.8  RDW 11.5 - 15.5 % 13.7  Platelets 150 - 400 K/uL 248  nRBC 0.0 - 0.2 % 0.0  (L): Data is abnormally low   HISTORY OF PRESENTING ILLNESS: Alone.  Ambulating independently.  Caitlin Salazar 56 y.o.  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  Complains of seasonal allergies.  Patient has been on IV iron infusions.  Noted to have improvement of energy levels.  Gets B12 injections/outpatient  Review of Systems  Constitutional:  Negative for chills, diaphoresis, fever and weight loss.  HENT:  Negative for nosebleeds and sore throat.   Eyes:  Negative for double vision.  Respiratory:  Negative for cough, hemoptysis, sputum production, shortness of breath and wheezing.   Cardiovascular:  Negative for chest pain, palpitations, orthopnea and leg swelling.  Gastrointestinal:  Negative for abdominal pain, blood in stool, constipation, diarrhea, heartburn, melena, nausea and vomiting.  Genitourinary:  Negative for dysuria, frequency and urgency.  Musculoskeletal:  Negative for back pain.  Skin: Negative.  Negative for itching and rash.  Neurological:  Negative for tingling, focal weakness,  weakness and headaches.  Endo/Heme/Allergies:  Does not bruise/bleed easily.  Psychiatric/Behavioral:  Negative for depression. The patient is not nervous/anxious and does not have insomnia.    MEDICAL HISTORY:  Past Medical History:  Diagnosis Date   Allergic rhinitis    Arthritis    left knee   Asthma    when younger   Environmental allergies    Fatigue    Hypertension    Lack of appetite    Menorrhagia    Vitamin D deficiency     SURGICAL HISTORY: Past Surgical History:  Procedure Laterality Date   BREAST BIOPSY Left 01/06/2018   bx/clip/ neg   CESAREAN SECTION     COLONOSCOPY WITH PROPOFOL N/A 04/11/2015   Procedure: COLONOSCOPY WITH PROPOFOL;  Surgeon: Lucilla Lame, MD;  Location: [facility];  Service: Endoscopy;  Laterality: N/A;  PLEASE LEAVE PT START TIME FOR 930   GASTRIC BYPASS     POLYPECTOMY  04/11/2015   Procedure: POLYPECTOMY;  Surgeon: Lucilla Lame, MD;  Location: [facility];  Service: Endoscopy;;    SOCIAL HISTORY: Social History   Socioeconomic History   Marital status: Legally Separated    Spouse name: Not on file   Number of children: Not on file   Years of education: Not on file   Highest education level: Not on file  Occupational History   Not on file  Tobacco Use   Smoking status: Never   Smokeless tobacco: Never  Vaping Use  Vaping Use: Never used  Substance and Sexual Activity   Alcohol use: Yes    Comment: last use 08/24/20 1x/mo   Drug use: Not Currently    Types: Marijuana    Comment: last use age 69   Sexual activity: Yes    Partners: Male  Other Topics Concern   Not on file  Social History Narrative   Comptroller; 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    Social Determinants of Health   Financial Resource Strain: Not on file  Food Insecurity: Not on file  Transportation Needs: Not on file  Physical Activity: Not on file  Stress: Not on file  Social  Connections: Not on file  Intimate Partner Violence: Not on file    FAMILY HISTORY: Family History  Problem Relation Age of Onset   Diabetes Father    Breast cancer Maternal Aunt    Breast cancer Cousin 58       mat cousin    ALLERGIES:  is allergic to penicillins.  MEDICATIONS:  Current Outpatient Medications  Medication Sig Dispense Refill   albuterol (VENTOLIN HFA) 108 (90 Base) MCG/ACT inhaler Inhale into the lungs. Reported on 04/11/2015     Cholecalciferol (VITAMIN D3) 1.25 MG (50000 UT) CAPS Take 1 capsule by mouth once a week.     fluticasone (FLONASE) 50 MCG/ACT nasal spray Place 2 sprays into both nostrils daily. 16 g 5   levocetirizine (XYZAL) 5 MG tablet Take 5 mg by mouth daily. AM     lisinopril-hydrochlorothiazide (ZESTORETIC) 10-12.5 MG tablet Take 1 tablet by mouth daily.     meloxicam (MOBIC) 15 MG tablet Take 15 mg by mouth daily.     oxybutynin (DITROPAN) 5 MG tablet Take 5 mg by mouth 2 (two) times daily.     SYRINGE-NEEDLE, DISP, 3 ML 21G X 1" 3 ML MISC For administration of b12 6 each 0   traZODone (DESYREL) 50 MG tablet Take 50 mg by mouth at bedtime.     cyanocobalamin (,VITAMIN B-12,) 1000 MCG/ML injection Inject into the muscle.     phentermine 30 MG capsule Take by mouth as needed. Reported on 04/11/2015 (Patient not taking: Reported on 05/19/2021)     QVAR REDIHALER 40 MCG/ACT inhaler Inhale 2 puffs into the lungs in the morning and at bedtime. (Patient not taking: Reported on 05/19/2021)     No current facility-administered medications for this visit.      PHYSICAL EXAMINATION:   Vitals:   05/19/21 1503  BP: 127/66  Pulse: 65  Resp: 16  Temp: 98.1 F (36.7 C)  SpO2: 99%   Filed Weights   05/19/21 1503  Weight: 276 lb 6.4 oz (125.4 kg)    Physical Exam Vitals and nursing note reviewed.  HENT:     Head: Normocephalic and atraumatic.     Mouth/Throat:     Pharynx: Oropharynx is clear.  Eyes:     Extraocular Movements: Extraocular  movements intact.     Pupils: Pupils are equal, round, and reactive to light.  Cardiovascular:     Rate and Rhythm: Normal rate and regular rhythm.  Pulmonary:     Comments: Decreased breath sounds bilaterally.  Abdominal:     Palpations: Abdomen is soft.  Musculoskeletal:        General: Normal range of motion.     Cervical back: Normal range of motion.  Skin:    General: Skin is warm.  Neurological:     General:  No focal deficit present.     Mental Status: She is alert and oriented to person, place, and time.  Psychiatric:        Behavior: Behavior normal.        Judgment: Judgment normal.   LABORATORY DATA:  I have reviewed the data as listed Lab Results  Component Value Date   WBC 7.1 05/12/2021   HGB 11.3 (L) 05/12/2021   HCT 35.5 (L) 05/12/2021   MCV 87.7 05/12/2021   PLT 248 05/12/2021   Recent Labs    12/16/20 1507  NA 133*  K 5.3*  CL 105  CO2 21*  GLUCOSE 106*  BUN 33*  CREATININE 1.51*  CALCIUM 9.3  GFRNONAA 40*     US RENAL  Result Date: 05/18/2021 CLINICAL DATA:  Stage 3 kidney disease EXAM: RENAL / URINARY TRACT ULTRASOUND COMPLETE COMPARISON:  None FINDINGS: Right Kidney: Renal measurements: 10.6 x 3.6 x 5.4 cm = volume: 107 mL. Cortex appears echogenic. No mass or hydronephrosis. Left Kidney: Renal measurements: 9.5 x 4.7 x 4.2 cm = volume: 99 mL. Cortex appears slightly echogenic. No mass or hydronephrosis. Bladder: Appears normal for degree of bladder distention. Other: None. IMPRESSION: 1. Increased cortical echogenicity consistent with medical renal disease. No hydronephrosis. Electronically Signed   By: Donavan Foil M.D.   On: 05/18/2021 18:57    Symptomatic anemia # Anemia-hemoglobin 7.5 [PCP-labs/scanned] suspect -iron deficiency/history of bariatric surgery; s/p IV iron infusions x5 hemoglobin today is 11.7.  Currently proceeding with IV Venofer.  Patient explained that she would need likely maintenance.  #Etiology: Likely secondary  bariatric surgery; colonoscopy 2017-Dr. Wohl.   # B12 def- at home/school-B12 injections.  [script]  # DISPOSITION: # Venofer today # follow up in 3 months- MD; labs- cbc; possible venofer- Dr.B    All questions were answered. The patient knows to call the clinic with any problems, questions or concerns.    Cammie Sickle, MD 05/19/2021 3:41 PM

## 2021-05-19 NOTE — Assessment & Plan Note (Addendum)
#   Anemia-hemoglobin 7.5 [PCP-labs/scanned] suspect -iron deficiency/history of bariatric surgery; s/p IV iron infusions x5 hemoglobin today is 11.7.  Currently proceeding with IV Venofer.  Patient explained that she would need likely maintenance. ? ?#Etiology: Likely secondary bariatric surgery; colonoscopy 2017-Dr. Wohl.  ? ?# B12 def- at home/school-B12 injections.  [script] ? ?# DISPOSITION: ?# Venofer today ?# follow up in 3 months- MD; labs- cbc; possible venofer- Dr.B ? ? ? ?

## 2021-05-25 ENCOUNTER — Ambulatory Visit: Payer: BC Managed Care – PPO | Admitting: Internal Medicine

## 2021-05-25 ENCOUNTER — Ambulatory Visit: Payer: BC Managed Care – PPO

## 2021-08-18 ENCOUNTER — Inpatient Hospital Stay (HOSPITAL_BASED_OUTPATIENT_CLINIC_OR_DEPARTMENT_OTHER): Payer: BC Managed Care – PPO | Admitting: Internal Medicine

## 2021-08-18 ENCOUNTER — Encounter: Payer: Self-pay | Admitting: Internal Medicine

## 2021-08-18 ENCOUNTER — Inpatient Hospital Stay: Payer: BC Managed Care – PPO

## 2021-08-18 ENCOUNTER — Inpatient Hospital Stay: Payer: BC Managed Care – PPO | Attending: Nurse Practitioner

## 2021-08-18 DIAGNOSIS — D649 Anemia, unspecified: Secondary | ICD-10-CM | POA: Diagnosis not present

## 2021-08-18 DIAGNOSIS — K911 Postgastric surgery syndromes: Secondary | ICD-10-CM | POA: Diagnosis present

## 2021-08-18 DIAGNOSIS — D508 Other iron deficiency anemias: Secondary | ICD-10-CM | POA: Diagnosis present

## 2021-08-18 DIAGNOSIS — Z9884 Bariatric surgery status: Secondary | ICD-10-CM | POA: Insufficient documentation

## 2021-08-18 DIAGNOSIS — E538 Deficiency of other specified B group vitamins: Secondary | ICD-10-CM | POA: Diagnosis present

## 2021-08-18 LAB — CBC WITH DIFFERENTIAL/PLATELET
Abs Immature Granulocytes: 0.01 10*3/uL (ref 0.00–0.07)
Basophils Absolute: 0 10*3/uL (ref 0.0–0.1)
Basophils Relative: 1 %
Eosinophils Absolute: 0.2 10*3/uL (ref 0.0–0.5)
Eosinophils Relative: 2 %
HCT: 34.5 % — ABNORMAL LOW (ref 36.0–46.0)
Hemoglobin: 10.9 g/dL — ABNORMAL LOW (ref 12.0–15.0)
Immature Granulocytes: 0 %
Lymphocytes Relative: 21 %
Lymphs Abs: 1.5 10*3/uL (ref 0.7–4.0)
MCH: 28.5 pg (ref 26.0–34.0)
MCHC: 31.6 g/dL (ref 30.0–36.0)
MCV: 90.3 fL (ref 80.0–100.0)
Monocytes Absolute: 0.4 10*3/uL (ref 0.1–1.0)
Monocytes Relative: 5 %
Neutro Abs: 5.1 10*3/uL (ref 1.7–7.7)
Neutrophils Relative %: 71 %
Platelets: 239 10*3/uL (ref 150–400)
RBC: 3.82 MIL/uL — ABNORMAL LOW (ref 3.87–5.11)
RDW: 13.5 % (ref 11.5–15.5)
WBC: 7.2 10*3/uL (ref 4.0–10.5)
nRBC: 0 % (ref 0.0–0.2)

## 2021-08-18 MED ORDER — IRON SUCROSE 20 MG/ML IV SOLN
200.0000 mg | Freq: Once | INTRAVENOUS | Status: AC
  Administered 2021-08-18: 200 mg via INTRAVENOUS
  Filled 2021-08-18: qty 10

## 2021-08-18 MED ORDER — SODIUM CHLORIDE 0.9 % IV SOLN
Freq: Once | INTRAVENOUS | Status: AC
  Filled 2021-08-18: qty 250

## 2021-08-18 MED ORDER — SODIUM CHLORIDE 0.9 % IV SOLN: 200.0000 mg | Freq: Once | INTRAVENOUS | Status: DC

## 2021-08-18 NOTE — Progress Notes (Signed)
[facility] CONSULT NOTE  Patient Care Team: Elgie Collard, MD as PCP - General (Obstetrics and Gynecology) Cammie Sickle, MD as Consulting Physician (Oncology)  CHIEF COMPLAINTS/PURPOSE OF CONSULTATION: ANEMIA   HEMATOLOGY HISTORY:  # ANEMIA  colonoscopy; gastric by pass [2004]; 2020- [facility] IV venofer; colo- 2017 [Dr.Wohl]  #Gastric bypass  [[facility]; 2004]   Latest Reference Range & Units 05/12/21 15:30  Iron 28 - 170 ug/dL 61  UIBC ug/dL 216  TIBC 250 - 450 ug/dL 277  Saturation Ratios 10.4 - 31.8 % 22  Ferritin 11 - 307 ng/mL 42  Vitamin B12 180 - 914 pg/mL 543  WBC 4.0 - 10.5 K/uL 7.1  RBC 3.87 - 5.11 MIL/uL 4.05  Hemoglobin 12.0 - 15.0 g/dL 11.3 (L)  HCT 36.0 - 46.0 % 35.5 (L)  MCV 80.0 - 100.0 fL 87.7  MCH 26.0 - 34.0 pg 27.9  MCHC 30.0 - 36.0 g/dL 31.8  RDW 11.5 - 15.5 % 13.7  Platelets 150 - 400 K/uL 248  nRBC 0.0 - 0.2 % 0.0  (L): Data is abnormally low   HISTORY OF PRESENTING ILLNESS: Alone.  Ambulating independently.  Caitlin Salazar 56 y.o.  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  Patient appetite improved.  Denies any constipation or diarrhea.  Denies any blood in stools or black or stools. Gets B12 injections/outpatient  Review of Systems  Constitutional:  Negative for chills, diaphoresis, fever and weight loss.  HENT:  Negative for nosebleeds and sore throat.   Eyes:  Negative for double vision.  Respiratory:  Negative for cough, hemoptysis, sputum production, shortness of breath and wheezing.   Cardiovascular:  Negative for chest pain, palpitations, orthopnea and leg swelling.  Gastrointestinal:  Negative for abdominal pain, blood in stool, constipation, diarrhea, heartburn, melena, nausea and vomiting.  Genitourinary:  Negative for dysuria, frequency and urgency.  Musculoskeletal:  Negative for back pain.  Skin: Negative.  Negative for itching and rash.   Neurological:  Negative for tingling, focal weakness, weakness and headaches.  Endo/Heme/Allergies:  Does not bruise/bleed easily.  Psychiatric/Behavioral:  Negative for depression. The patient is not nervous/anxious and does not have insomnia.    MEDICAL HISTORY:  Past Medical History:  Diagnosis Date   Allergic rhinitis    Arthritis    left knee   Asthma    when younger   Environmental allergies    Fatigue    Hypertension    Lack of appetite    Menorrhagia    Vitamin D deficiency     SURGICAL HISTORY: Past Surgical History:  Procedure Laterality Date   BREAST BIOPSY Left 01/06/2018   bx/clip/ neg   CESAREAN SECTION     COLONOSCOPY WITH PROPOFOL N/A 04/11/2015   Procedure: COLONOSCOPY WITH PROPOFOL;  Surgeon: Lucilla Lame, MD;  Location: [facility];  Service: Endoscopy;  Laterality: N/A;  PLEASE LEAVE PT START TIME FOR 930   GASTRIC BYPASS     POLYPECTOMY  04/11/2015   Procedure: POLYPECTOMY;  Surgeon: Lucilla Lame, MD;  Location: [facility];  Service: Endoscopy;;    SOCIAL HISTORY: Social History   Socioeconomic History   Marital status: Legally Separated    Spouse name: Not on file   Number of children: Not on file   Years of education: Not on file   Highest education level: Not on file  Occupational History   Not on file  Tobacco Use   Smoking status: Never   Smokeless  tobacco: Never  Vaping Use   Vaping Use: Never used  Substance and Sexual Activity   Alcohol use: Yes    Comment: last use 08/24/20 1x/mo   Drug use: Not Currently    Types: Marijuana    Comment: last use age 17   Sexual activity: Yes    Partners: Male  Other Topics Concern   Not on file  Social History Narrative   Comptroller; 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    Social Determinants of Health   Financial Resource Strain: Not on file  Food Insecurity: Not on file  Transportation Needs: Not on file  Physical  Activity: Not on file  Stress: Not on file  Social Connections: Not on file  Intimate Partner Violence: Not on file    FAMILY HISTORY: Family History  Problem Relation Age of Onset   Diabetes Father    Breast cancer Maternal Aunt    Breast cancer Cousin 47       mat cousin    ALLERGIES:  is allergic to penicillins.  MEDICATIONS:  Current Outpatient Medications  Medication Sig Dispense Refill   albuterol (VENTOLIN HFA) 108 (90 Base) MCG/ACT inhaler Inhale into the lungs. Reported on 04/11/2015     Cholecalciferol (VITAMIN D3) 1.25 MG (50000 UT) CAPS Take 1 capsule by mouth once a week.     cyanocobalamin (,VITAMIN B-12,) 1000 MCG/ML injection Inject into the muscle.     fluticasone (FLONASE) 50 MCG/ACT nasal spray Place 2 sprays into both nostrils daily. 16 g 5   levocetirizine (XYZAL) 5 MG tablet Take 5 mg by mouth daily. AM     lisinopril-hydrochlorothiazide (ZESTORETIC) 10-12.5 MG tablet Take 1 tablet by mouth daily.     meloxicam (MOBIC) 15 MG tablet Take 15 mg by mouth daily.     oxybutynin (DITROPAN) 5 MG tablet Take 5 mg by mouth 2 (two) times daily.     SYRINGE-NEEDLE, DISP, 3 ML 21G X 1" 3 ML MISC For administration of b12 6 each 0   traZODone (DESYREL) 50 MG tablet Take 50 mg by mouth at bedtime.     phentermine 30 MG capsule Take by mouth as needed. Reported on 04/11/2015 (Patient not taking: Reported on 05/19/2021)     QVAR REDIHALER 40 MCG/ACT inhaler Inhale 2 puffs into the lungs in the morning and at bedtime. (Patient not taking: Reported on 05/19/2021)     No current facility-administered medications for this visit.   Facility-Administered Medications Ordered in Other Visits  Medication Dose Route Frequency Provider Last Rate Last Admin   iron sucrose (VENOFER) injection 200 mg  200 mg Intravenous Once Charlaine Dalton R, MD          PHYSICAL EXAMINATION:   Vitals:   08/18/21 1300  BP: (!) 140/91  Pulse: 76  Resp: 16  Temp: 98 F (36.7 C)   Filed  Weights   08/18/21 1300  Weight: 269 lb 12.8 oz (122.4 kg)    Physical Exam Vitals and nursing note reviewed.  HENT:     Head: Normocephalic and atraumatic.     Mouth/Throat:     Pharynx: Oropharynx is clear.  Eyes:     Extraocular Movements: Extraocular movements intact.     Pupils: Pupils are equal, round, and reactive to light.  Cardiovascular:     Rate and Rhythm: Normal rate and regular rhythm.  Pulmonary:     Comments: Decreased breath sounds bilaterally.  Abdominal:  Palpations: Abdomen is soft.  Musculoskeletal:        General: Normal range of motion.     Cervical back: Normal range of motion.  Skin:    General: Skin is warm.  Neurological:     General: No focal deficit present.     Mental Status: She is alert and oriented to person, place, and time.  Psychiatric:        Behavior: Behavior normal.        Judgment: Judgment normal.   LABORATORY DATA:  I have reviewed the data as listed Lab Results  Component Value Date   WBC 7.2 08/18/2021   HGB 10.9 (L) 08/18/2021   HCT 34.5 (L) 08/18/2021   MCV 90.3 08/18/2021   PLT 239 08/18/2021   Recent Labs    12/16/20 1507  NA 133*  K 5.3*  CL 105  CO2 21*  GLUCOSE 106*  BUN 33*  CREATININE 1.51*  CALCIUM 9.3  GFRNONAA 40*     No results found.  Symptomatic anemia # Anemia-hemoglobin 7.5 [PCP-labs/scanned] suspect -iron deficiency/history of bariatric surgery; s/p IV iron infusions. Patient explained that she would need likely maintenance.  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 Wohl.   # B12 def- at home/school-B12 injections.  [script]- STABLE  # DISPOSITION: # Venofer today # follow up in 82month- MD; labs- cbc;bmp; iron studies; ferritin; B12 levels;  possible venofer- Dr.B    All questions were answered. The patient knows to call the clinic with any problems, questions or concerns.    GCammie Sickle  MD 08/18/2021 1:25 PM

## 2021-08-18 NOTE — Progress Notes (Signed)
Patient denies new problems/concerns today.   °

## 2021-08-18 NOTE — Assessment & Plan Note (Addendum)
#   Anemia-hemoglobin 7.5 [PCP-labs/scanned] suspect -iron deficiency/history of bariatric surgery; s/p IV iron infusions. Patient explained that she would need likely maintenance.  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 Wohl.   # B12 def- at home/school-B12 injections.  [script]- STABLE  # DISPOSITION: # Venofer today # follow up in 47month- MD; labs- cbc;bmp; iron studies; ferritin; B12 levels;  possible venofer- Dr.B

## 2021-08-18 NOTE — Patient Instructions (Signed)
[facility]  Discharge Instructions: ?Thank you for choosing [facility] to provide your oncology and hematology care.  ?If you have a lab appointment with the Cancer Center, please go directly to the Cancer Center and check in at the registration area. ? ?Wear comfortable clothing and clothing appropriate for easy access to any Portacath or PICC line.  ? ?We strive to give you quality time with your provider. You may need to reschedule your appointment if you arrive late (15 or more minutes).  Arriving late affects you and other patients whose appointments are after yours.  Also, if you miss three or more appointments without notifying the office, you may be dismissed from the clinic at the provider?s discretion.    ?  ?For prescription refill requests, have your pharmacy contact our office and allow 72 hours for refills to be completed.   ? ?Today you received the following chemotherapy and/or immunotherapy agents VENOFER ?    ?  ?To help prevent nausea and vomiting after your treatment, we encourage you to take your nausea medication as directed. ? ?BELOW ARE SYMPTOMS THAT SHOULD BE REPORTED IMMEDIATELY: ?*FEVER GREATER THAN 100.4 F (38 ?C) OR HIGHER ?*CHILLS OR SWEATING ?*NAUSEA AND VOMITING THAT IS NOT CONTROLLED WITH YOUR NAUSEA MEDICATION ?*UNUSUAL SHORTNESS OF BREATH ?*UNUSUAL BRUISING OR BLEEDING ?*URINARY PROBLEMS (pain or burning when urinating, or frequent urination) ?*BOWEL PROBLEMS (unusual diarrhea, constipation, pain near the anus) ?TENDERNESS IN MOUTH AND THROAT WITH OR WITHOUT PRESENCE OF ULCERS (sore throat, sores in mouth, or a toothache) ?UNUSUAL RASH, SWELLING OR PAIN  ?UNUSUAL VAGINAL DISCHARGE OR ITCHING  ? ?Items with * indicate a potential emergency and should be followed up as soon as possible or go to the Emergency Department if any problems should occur. ? ?Please show the CHEMOTHERAPY ALERT CARD or IMMUNOTHERAPY ALERT CARD at check-in to  the Emergency Department and triage nurse. ? ?Should you have questions after your visit or need to cancel or reschedule your appointment, please contact [facility]  [phone] and follow the prompts.  Office hours are 8:00 a.m. to 4:30 p.m. Monday - Friday. Please note that voicemails left after 4:00 p.m. may not be returned until the following business day.  We are closed weekends and major holidays. You have access to a nurse at all times for urgent questions. Please call the main number to the clinic [phone] and follow the prompts. ? ?For any non-urgent questions, you may also contact your provider using MyChart. We now offer e-Visits for anyone 18 and older to request care online for non-urgent symptoms. For details visit [email or web address]. ?  ?Also download the MyChart app! Go to the app store, search "MyChart", open the app, select [facility], and log in with your MyChart username and password. ? ?Due to Covid, a mask is required upon entering the hospital/clinic. If you do not have a mask, one will be given to you upon arrival. For doctor visits, patients may have 1 support person aged 18 or older with them. For treatment visits, patients cannot have anyone with them due to current Covid guidelines and our immunocompromised population.  ? ?Iron Sucrose Injection ?What is this medication? ?IRON SUCROSE (EYE ern SOO krose) treats low levels of iron (iron deficiency anemia) in people with kidney disease. Iron is a mineral that plays an important role in making red blood cells, which carry oxygen from your lungs to the rest of your body. ?This medicine   may be used for other purposes; ask your health care provider or pharmacist if you have questions. ?COMMON BRAND NAME(S): Venofer ?What should I tell my care team before I take this medication? ?They need to know if you have any of these conditions: ?Anemia not caused by low iron levels ?Heart disease ?High levels of  iron in the blood ?Kidney disease ?Liver disease ?An unusual or allergic reaction to iron, other medications, foods, dyes, or preservatives ?Pregnant or trying to get pregnant ?Breast-feeding ?How should I use this medication? ?This medication is for infusion into a vein. It is given in a hospital or clinic setting. ?Talk to your care team about the use of this medication in children. While this medication may be prescribed for children as young as 2 years for selected conditions, precautions do apply. ?Overdosage: If you think you have taken too much of this medicine contact a poison control center or emergency room at once. ?NOTE: This medicine is only for you. Do not share this medicine with others. ?What if I miss a dose? ?It is important not to miss your dose. Call your care team if you are unable to keep an appointment. ?What may interact with this medication? ?Do not take this medication with any of the following: ?Deferoxamine ?Dimercaprol ?Other iron products ?This medication may also interact with the following: ?Chloramphenicol ?Deferasirox ?This list may not describe all possible interactions. Give your health care provider a list of all the medicines, herbs, non-prescription drugs, or dietary supplements you use. Also tell them if you smoke, drink alcohol, or use illegal drugs. Some items may interact with your medicine. ?What should I watch for while using this medication? ?Visit your care team regularly. Tell your care team if your symptoms do not start to get better or if they get worse. You may need blood work done while you are taking this medication. ?You may need to follow a special diet. Talk to your care team. Foods that contain iron include: whole grains/cereals, dried fruits, beans, or peas, leafy green vegetables, and organ meats (liver, kidney). ?What side effects may I notice from receiving this medication? ?Side effects that you should report to your care team as soon as  possible: ?Allergic reactions--skin rash, itching, hives, swelling of the face, lips, tongue, or throat ?Low blood pressure--dizziness, feeling faint or lightheaded, blurry vision ?Shortness of breath ?Side effects that usually do not require medical attention (report to your care team if they continue or are bothersome): ?Flushing ?Headache ?Joint pain ?Muscle pain ?Nausea ?Pain, redness, or irritation at injection site ?This list may not describe all possible side effects. Call your doctor for medical advice about side effects. You may report side effects to FDA at 1-800-FDA-1088. ?Where should I keep my medication? ?This medication is given in a hospital or clinic and will not be stored at home. ?NOTE: This sheet is a summary. It may not cover all possible information. If you have questions about this medicine, talk to your doctor, pharmacist, or health care provider. ?? 2023 Elsevier/Gold Standard (2020-07-25 00:00:00) ? ?

## 2021-09-30 ENCOUNTER — Other Ambulatory Visit: Payer: Self-pay | Admitting: Nurse Practitioner

## 2021-10-06 ENCOUNTER — Other Ambulatory Visit: Payer: Self-pay

## 2021-10-06 DIAGNOSIS — D649 Anemia, unspecified: Secondary | ICD-10-CM

## 2021-11-20 ENCOUNTER — Other Ambulatory Visit: Payer: Self-pay | Admitting: Internal Medicine

## 2021-11-20 MED ORDER — CYANOCOBALAMIN 1000 MCG/ML IJ SOLN: 1000.0000 ug | INTRAMUSCULAR | 3 refills | Status: DC

## 2021-12-18 ENCOUNTER — Inpatient Hospital Stay: Payer: BC Managed Care – PPO

## 2021-12-18 ENCOUNTER — Encounter: Payer: Self-pay | Admitting: Internal Medicine

## 2021-12-18 ENCOUNTER — Inpatient Hospital Stay: Payer: BC Managed Care – PPO | Attending: Internal Medicine | Admitting: Internal Medicine

## 2021-12-18 VITALS — BP 129/65 | HR 52

## 2021-12-18 DIAGNOSIS — D649 Anemia, unspecified: Secondary | ICD-10-CM | POA: Diagnosis present

## 2021-12-18 DIAGNOSIS — N183 Chronic kidney disease, stage 3 unspecified: Secondary | ICD-10-CM | POA: Insufficient documentation

## 2021-12-18 DIAGNOSIS — E538 Deficiency of other specified B group vitamins: Secondary | ICD-10-CM | POA: Insufficient documentation

## 2021-12-18 DIAGNOSIS — Z9884 Bariatric surgery status: Secondary | ICD-10-CM | POA: Diagnosis not present

## 2021-12-18 LAB — IRON AND TIBC
Iron: 54 ug/dL (ref 28–170)
Saturation Ratios: 21 % (ref 10.4–31.8)
TIBC: 252 ug/dL (ref 250–450)
UIBC: 198 ug/dL

## 2021-12-18 LAB — CBC WITH DIFFERENTIAL/PLATELET
Abs Immature Granulocytes: 0.01 10*3/uL (ref 0.00–0.07)
Basophils Absolute: 0 10*3/uL (ref 0.0–0.1)
Basophils Relative: 1 %
Eosinophils Absolute: 0.2 10*3/uL (ref 0.0–0.5)
Eosinophils Relative: 3 %
HCT: 33.6 % — ABNORMAL LOW (ref 36.0–46.0)
Hemoglobin: 10.5 g/dL — ABNORMAL LOW (ref 12.0–15.0)
Immature Granulocytes: 0 %
Lymphocytes Relative: 22 %
Lymphs Abs: 1.4 10*3/uL (ref 0.7–4.0)
MCH: 27.6 pg (ref 26.0–34.0)
MCHC: 31.3 g/dL (ref 30.0–36.0)
MCV: 88.4 fL (ref 80.0–100.0)
Monocytes Absolute: 0.5 10*3/uL (ref 0.1–1.0)
Monocytes Relative: 7 %
Neutro Abs: 4.3 10*3/uL (ref 1.7–7.7)
Neutrophils Relative %: 67 %
Platelets: 244 10*3/uL (ref 150–400)
RBC: 3.8 MIL/uL — ABNORMAL LOW (ref 3.87–5.11)
RDW: 14.7 % (ref 11.5–15.5)
WBC: 6.4 10*3/uL (ref 4.0–10.5)
nRBC: 0 % (ref 0.0–0.2)

## 2021-12-18 LAB — BASIC METABOLIC PANEL
Anion gap: 0 — ABNORMAL LOW (ref 5–15)
BUN: 34 mg/dL — ABNORMAL HIGH (ref 6–20)
CO2: 20 mmol/L — ABNORMAL LOW (ref 22–32)
Calcium: 8.7 mg/dL — ABNORMAL LOW (ref 8.9–10.3)
Chloride: 115 mmol/L — ABNORMAL HIGH (ref 98–111)
Creatinine, Ser: 1.45 mg/dL — ABNORMAL HIGH (ref 0.44–1.00)
GFR, Estimated: 42 mL/min — ABNORMAL LOW (ref >60)
Glucose, Bld: 110 mg/dL — ABNORMAL HIGH (ref 70–99)
Potassium: 4.5 mmol/L (ref 3.5–5.1)
Sodium: 135 mmol/L (ref 135–145)

## 2021-12-18 LAB — VITAMIN B12: Vitamin B-12: 297 pg/mL (ref 180–914)

## 2021-12-18 LAB — FERRITIN: Ferritin: 68 ng/mL (ref 11–307)

## 2021-12-18 MED ORDER — SODIUM CHLORIDE 0.9 % IV SOLN
Freq: Once | INTRAVENOUS | Status: AC
  Filled 2021-12-18: qty 250

## 2021-12-18 MED ORDER — SODIUM CHLORIDE 0.9 % IV SOLN
200.0000 mg | Freq: Once | INTRAVENOUS | Status: AC
  Administered 2021-12-18: 200 mg via INTRAVENOUS
  Filled 2021-12-18: qty 200

## 2021-12-18 NOTE — Progress Notes (Signed)
Patient denies new problems/concerns today.   °

## 2021-12-18 NOTE — Progress Notes (Signed)
[name] NOTE  Patient Care Team: Elgie Collard, MD as PCP - General (Obstetrics and Gynecology) Cammie Sickle, MD as Consulting Physician (Oncology)  CHIEF COMPLAINTS/PURPOSE OF CONSULTATION: ANEMIA   HEMATOLOGY HISTORY:  # ANEMIA  colonoscopy; gastric by pass [9678]; 2020- [facility] IV venofer; colo- 2017 [Dr.Wohl]  #Gastric bypass  [[facility]; 2004]; CKD stage III    Latest Reference Range & Units 05/12/21 15:30  Iron 28 - 170 ug/dL 61  UIBC ug/dL 216  TIBC 250 - 450 ug/dL 277  Saturation Ratios 10.4 - 31.8 % 22  Ferritin 11 - 307 ng/mL 42  Vitamin B12 180 - 914 pg/mL 543  WBC 4.0 - 10.5 K/uL 7.1  RBC 3.87 - 5.11 MIL/uL 4.05  Hemoglobin 12.0 - 15.0 g/dL 11.3 (L)  HCT 36.0 - 46.0 % 35.5 (L)  MCV 80.0 - 100.0 fL 87.7  MCH 26.0 - 34.0 pg 27.9  MCHC 30.0 - 36.0 g/dL 31.8  RDW 11.5 - 15.5 % 13.7  Platelets 150 - 400 K/uL 248  nRBC 0.0 - 0.2 % 0.0  (L): Data is abnormally low   HISTORY OF PRESENTING ILLNESS: Alone.  Ambulating independently.  Caitlin Salazar 56 y.o.  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  Patient missed her kidney appointment.  She stated she was busy taking care of for the family members.  Patient appetite improved.  Denies any constipation or diarrhea.  Denies any blood in stools or black or stools.   Review of Systems  Constitutional:  Negative for chills, diaphoresis, fever and weight loss.  HENT:  Negative for nosebleeds and sore throat.   Eyes:  Negative for double vision.  Respiratory:  Negative for cough, hemoptysis, sputum production, shortness of breath and wheezing.   Cardiovascular:  Negative for chest pain, palpitations, orthopnea and leg swelling.  Gastrointestinal:  Negative for abdominal pain, blood in stool, constipation, diarrhea, heartburn, melena, nausea and vomiting.  Genitourinary:  Negative for dysuria, frequency and urgency.   Musculoskeletal:  Negative for back pain.  Skin: Negative.  Negative for itching and rash.  Neurological:  Negative for tingling, focal weakness, weakness and headaches.  Endo/Heme/Allergies:  Does not bruise/bleed easily.  Psychiatric/Behavioral:  Negative for depression. The patient is not nervous/anxious and does not have insomnia.     MEDICAL HISTORY:  Past Medical History:  Diagnosis Date   Allergic rhinitis    Arthritis    left knee   Asthma    when younger   Environmental allergies    Fatigue    Hypertension    Lack of appetite    Menorrhagia    Vitamin D deficiency     SURGICAL HISTORY: Past Surgical History:  Procedure Laterality Date   BREAST BIOPSY Left 01/06/2018   bx/clip/ neg   CESAREAN SECTION     COLONOSCOPY WITH PROPOFOL N/A 04/11/2015   Procedure: COLONOSCOPY WITH PROPOFOL;  Surgeon: Lucilla Lame, MD;  Location: [facility];  Service: Endoscopy;  Laterality: N/A;  PLEASE LEAVE PT START TIME FOR 930   GASTRIC BYPASS     POLYPECTOMY  04/11/2015   Procedure: POLYPECTOMY;  Surgeon: Lucilla Lame, MD;  Location: [facility];  Service: Endoscopy;;    SOCIAL HISTORY: Social History   Socioeconomic History   Marital status: Legally Separated    Spouse name: Not on file   Number of children: Not on file   Years of education: Not on file   Highest education level:  Not on file  Occupational History   Not on file  Tobacco Use   Smoking status: Never   Smokeless tobacco: Never  Vaping Use   Vaping Use: Never used  Substance and Sexual Activity   Alcohol use: Yes    Comment: last use 08/24/20 1x/mo   Drug use: Not Currently    Types: Marijuana    Comment: last use age 61   Sexual activity: Yes    Partners: Male  Other Topics Concern   Not on file  Social History Narrative   Comptroller; 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    Social Determinants of Health   Financial Resource  Strain: Not on file  Food Insecurity: Not on file  Transportation Needs: Not on file  Physical Activity: Not on file  Stress: Not on file  Social Connections: Not on file  Intimate Partner Violence: Not on file    FAMILY HISTORY: Family History  Problem Relation Age of Onset   Diabetes Father    Breast cancer Maternal Aunt    Breast cancer Cousin 35       mat cousin    ALLERGIES:  is allergic to penicillins.  MEDICATIONS:  Current Outpatient Medications  Medication Sig Dispense Refill   albuterol (VENTOLIN HFA) 108 (90 Base) MCG/ACT inhaler Inhale into the lungs. Reported on 04/11/2015     Cholecalciferol (VITAMIN D3) 1.25 MG (50000 UT) CAPS Take 1 capsule by mouth once a week.     cyanocobalamin (VITAMIN B12) 1000 MCG/ML injection Inject 1 mL (1,000 mcg total) into the muscle every 30 (thirty) days. 6 mL 3   fluticasone (FLONASE) 50 MCG/ACT nasal spray Place 2 sprays into both nostrils daily. 16 g 5   levocetirizine (XYZAL) 5 MG tablet Take 5 mg by mouth daily. AM     lisinopril-hydrochlorothiazide (ZESTORETIC) 10-12.5 MG tablet Take 1 tablet by mouth daily.     meloxicam (MOBIC) 15 MG tablet Take 15 mg by mouth daily.     oxybutynin (DITROPAN) 5 MG tablet Take 5 mg by mouth 2 (two) times daily.     SYRINGE-NEEDLE, DISP, 3 ML 21G X 1" 3 ML MISC For administration of b12 6 each 0   traZODone (DESYREL) 50 MG tablet Take 50 mg by mouth at bedtime.     phentermine 30 MG capsule Take by mouth as needed. Reported on 04/11/2015 (Patient not taking: Reported on 05/19/2021)     QVAR REDIHALER 40 MCG/ACT inhaler Inhale 2 puffs into the lungs in the morning and at bedtime. (Patient not taking: Reported on 05/19/2021)     No current facility-administered medications for this visit.      PHYSICAL EXAMINATION:   Vitals:   12/18/21 1300  BP: 133/64  Pulse: (!) 53  Resp: 16  Temp: (!) 97.4 F (36.3 C)   Filed Weights   12/18/21 1300  Weight: 275 lb 9.6 oz (125 kg)    Physical  Exam Vitals and nursing note reviewed.  HENT:     Head: Normocephalic and atraumatic.     Mouth/Throat:     Pharynx: Oropharynx is clear.  Eyes:     Extraocular Movements: Extraocular movements intact.     Pupils: Pupils are equal, round, and reactive to light.  Cardiovascular:     Rate and Rhythm: Normal rate and regular rhythm.  Pulmonary:     Comments: Decreased breath sounds bilaterally.  Abdominal:     Palpations: Abdomen is soft.  Musculoskeletal:  General: Normal range of motion.     Cervical back: Normal range of motion.  Skin:    General: Skin is warm.  Neurological:     General: No focal deficit present.     Mental Status: She is alert and oriented to person, place, and time.  Psychiatric:        Behavior: Behavior normal.        Judgment: Judgment normal.    LABORATORY DATA:  I have reviewed the data as listed Lab Results  Component Value Date   WBC 6.4 12/18/2021   HGB 10.5 (L) 12/18/2021   HCT 33.6 (L) 12/18/2021   MCV 88.4 12/18/2021   PLT 244 12/18/2021   Recent Labs    12/18/21 1304  NA 135  K 4.5  CL 115*  CO2 20*  GLUCOSE 110*  BUN 34*  CREATININE 1.45*  CALCIUM 8.7*  GFRNONAA 42*     No results found.  Symptomatic anemia # Anemia-hemoglobin 7.5 [PCP-labs/scanned] suspect -iron deficiency/history of bariatric surgery; s/p IV iron infusions. Patient explained that she would need likely maintenance.  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 Wohl.   # CKD - stage III  Missed kidney doctor's appt]  # B12 def- at home/school-B12 injections.  [script]- STABLE  # DISPOSITION: # Venofer today # follow up in 4 months- MD; labs- cbc;bmp; iron studies; ferritin; B12 levels;  possible venofer- Dr.B     All questions were answered. The patient knows to call the clinic with any problems, questions or concerns.    Cammie Sickle, MD 12/18/2021 2:04 PM

## 2021-12-18 NOTE — Assessment & Plan Note (Addendum)
#   Anemia-hemoglobin 7.5 [PCP-labs/scanned] suspect -iron deficiency/history of bariatric surgery; s/p IV iron infusions. Patient explained that she would need likely maintenance.  # Proceed with Venofer today- Hb- 10.5-today. overall stable.   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 Wohl.   # CKD - stage III  Missed kidney doctor's appt] -strongly recommend that she keep appointment with kidney doctor.  # B12 def- at home/school-B12 injections.  [script]- STABLE  # DISPOSITION: # Venofer today # follow up in 4 months- MD; labs- cbc;bmp; iron studies; ferritin; B12 levels;  possible venofer- Dr.B

## 2021-12-18 NOTE — Patient Instructions (Signed)

## 2021-12-31 ENCOUNTER — Encounter: Payer: Self-pay | Admitting: Internal Medicine

## 2022-02-12 DIAGNOSIS — S86912A Strain of unspecified muscle(s) and tendon(s) at lower leg level, left leg, initial encounter: Secondary | ICD-10-CM | POA: Insufficient documentation

## 2022-02-16 ENCOUNTER — Other Ambulatory Visit: Payer: Self-pay | Admitting: Obstetrics and Gynecology

## 2022-02-16 DIAGNOSIS — Z1231 Encounter for screening mammogram for malignant neoplasm of breast: Secondary | ICD-10-CM

## 2022-03-01 ENCOUNTER — Ambulatory Visit
Admission: RE | Admit: 2022-03-01 | Discharge: 2022-03-01 | Disposition: A | Discharge status: Home / Self Care | Payer: BC Managed Care – PPO | Source: Ambulatory Visit | Attending: Obstetrics and Gynecology | Admitting: Obstetrics and Gynecology

## 2022-03-01 DIAGNOSIS — Z1231 Encounter for screening mammogram for malignant neoplasm of breast: Secondary | ICD-10-CM | POA: Insufficient documentation

## 2022-04-08 DIAGNOSIS — M25562 Pain in left knee: Secondary | ICD-10-CM | POA: Insufficient documentation

## 2022-04-20 ENCOUNTER — Inpatient Hospital Stay (HOSPITAL_BASED_OUTPATIENT_CLINIC_OR_DEPARTMENT_OTHER): Payer: BC Managed Care – PPO | Admitting: Internal Medicine

## 2022-04-20 ENCOUNTER — Encounter: Payer: Self-pay | Admitting: Internal Medicine

## 2022-04-20 ENCOUNTER — Inpatient Hospital Stay: Payer: BC Managed Care – PPO

## 2022-04-20 ENCOUNTER — Inpatient Hospital Stay: Payer: BC Managed Care – PPO | Attending: Internal Medicine

## 2022-04-20 VITALS — BP 137/72 | HR 60 | Temp 96.7°F | Resp 16 | Wt 269.0 lb

## 2022-04-20 VITALS — BP 138/90 | HR 88

## 2022-04-20 DIAGNOSIS — Z9884 Bariatric surgery status: Secondary | ICD-10-CM | POA: Insufficient documentation

## 2022-04-20 DIAGNOSIS — Z79899 Other long term (current) drug therapy: Secondary | ICD-10-CM | POA: Insufficient documentation

## 2022-04-20 DIAGNOSIS — I129 Hypertensive chronic kidney disease with stage 1 through stage 4 chronic kidney disease, or unspecified chronic kidney disease: Secondary | ICD-10-CM | POA: Diagnosis not present

## 2022-04-20 DIAGNOSIS — K912 Postsurgical malabsorption, not elsewhere classified: Secondary | ICD-10-CM | POA: Insufficient documentation

## 2022-04-20 DIAGNOSIS — D649 Anemia, unspecified: Secondary | ICD-10-CM

## 2022-04-20 DIAGNOSIS — D508 Other iron deficiency anemias: Secondary | ICD-10-CM | POA: Insufficient documentation

## 2022-04-20 DIAGNOSIS — N183 Chronic kidney disease, stage 3 unspecified: Secondary | ICD-10-CM | POA: Diagnosis not present

## 2022-04-20 LAB — CBC WITH DIFFERENTIAL/PLATELET
Abs Immature Granulocytes: 0.03 10*3/uL (ref 0.00–0.07)
Basophils Absolute: 0 10*3/uL (ref 0.0–0.1)
Basophils Relative: 1 %
Eosinophils Absolute: 0.1 10*3/uL (ref 0.0–0.5)
Eosinophils Relative: 1 %
HCT: 37.3 % (ref 36.0–46.0)
Hemoglobin: 11.6 g/dL — ABNORMAL LOW (ref 12.0–15.0)
Immature Granulocytes: 0 %
Lymphocytes Relative: 20 %
Lymphs Abs: 1.5 10*3/uL (ref 0.7–4.0)
MCH: 28.2 pg (ref 26.0–34.0)
MCHC: 31.1 g/dL (ref 30.0–36.0)
MCV: 90.5 fL (ref 80.0–100.0)
Monocytes Absolute: 0.4 10*3/uL (ref 0.1–1.0)
Monocytes Relative: 6 %
Neutro Abs: 5.3 10*3/uL (ref 1.7–7.7)
Neutrophils Relative %: 72 %
Platelets: 266 10*3/uL (ref 150–400)
RBC: 4.12 MIL/uL (ref 3.87–5.11)
RDW: 14.3 % (ref 11.5–15.5)
WBC: 7.4 10*3/uL (ref 4.0–10.5)
nRBC: 0 % (ref 0.0–0.2)

## 2022-04-20 LAB — FERRITIN: Ferritin: 92 ng/mL (ref 11–307)

## 2022-04-20 LAB — BASIC METABOLIC PANEL
Anion gap: 6 (ref 5–15)
BUN: 30 mg/dL — ABNORMAL HIGH (ref 6–20)
CO2: 22 mmol/L (ref 22–32)
Calcium: 8.9 mg/dL (ref 8.9–10.3)
Chloride: 110 mmol/L (ref 98–111)
Creatinine, Ser: 1.29 mg/dL — ABNORMAL HIGH (ref 0.44–1.00)
GFR, Estimated: 48 mL/min — ABNORMAL LOW (ref >60)
Glucose, Bld: 89 mg/dL (ref 70–99)
Potassium: 4.4 mmol/L (ref 3.5–5.1)
Sodium: 138 mmol/L (ref 135–145)

## 2022-04-20 LAB — IRON AND TIBC
Iron: 48 ug/dL (ref 28–170)
Saturation Ratios: 20 % (ref 10.4–31.8)
TIBC: 235 ug/dL — ABNORMAL LOW (ref 250–450)
UIBC: 187 ug/dL

## 2022-04-20 LAB — VITAMIN B12: Vitamin B-12: 392 pg/mL (ref 180–914)

## 2022-04-20 MED ORDER — CYANOCOBALAMIN 1000 MCG/ML IJ SOLN: 1000.0000 ug | INTRAMUSCULAR | 4 refills | Status: DC

## 2022-04-20 MED ORDER — SODIUM CHLORIDE 0.9 % IV SOLN
200.0000 mg | Freq: Once | INTRAVENOUS | Status: AC
  Administered 2022-04-20: 200 mg via INTRAVENOUS
  Filled 2022-04-20: qty 200

## 2022-04-20 MED ORDER — SODIUM CHLORIDE 0.9 % IV SOLN
Freq: Once | INTRAVENOUS | Status: AC
  Filled 2022-04-20: qty 250

## 2022-04-20 NOTE — Progress Notes (Signed)
Denies any visible blood in stools. Denies any dizziness or dyspnea. Pt does have occasional fatigue, it is much better than it was not yet normal. Appetite is good.

## 2022-04-20 NOTE — Assessment & Plan Note (Addendum)
#   Anemia-hemoglobin 7.5 [PCP-labs/scanned] suspect -iron deficiency/history of bariatric surgery; s/p IV iron infusions. Patient explained that she would need likely maintenance. OCT 2023- Iron sat-23; ferritin-68  # Proceed with Venofer today- Hb- 11.7 -today. overall stable.   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 Wohl.   # CKD - stage III  s/p eval with nephology- GFR- 42. Stable.   # B12 def- at home/school-B12 injections.  [Refilled script]- stable.   # DISPOSITION: # Venofer today # follow up in 4 months- MD; labs- cbc;bmp; iron studies; ferritin; B12 levels;  possible venofer- Dr.B

## 2022-04-20 NOTE — Progress Notes (Signed)
[facility] NOTE  Patient Care Team: Elgie Collard, MD as PCP - General (Obstetrics and Gynecology) Cammie Sickle, MD as Consulting Physician (Oncology)  CHIEF COMPLAINTS/PURPOSE OF CONSULTATION: ANEMIA   HEMATOLOGY HISTORY:  # ANEMIA  colonoscopy; gastric by pass [6333]; 2020- [facility] IV venofer; colo- 2017 [Dr.Wohl]  #Gastric bypass  [[facility]; 2004]; CKD stage III    Latest Reference Range & Units 05/12/21 15:30  Iron 28 - 170 ug/dL 61  UIBC ug/dL 216  TIBC 250 - 450 ug/dL 277  Saturation Ratios 10.4 - 31.8 % 22  Ferritin 11 - 307 ng/mL 42  Vitamin B12 180 - 914 pg/mL 543  WBC 4.0 - 10.5 K/uL 7.1  RBC 3.87 - 5.11 MIL/uL 4.05  Hemoglobin 12.0 - 15.0 g/dL 11.3 (L)  HCT 36.0 - 46.0 % 35.5 (L)  MCV 80.0 - 100.0 fL 87.7  MCH 26.0 - 34.0 pg 27.9  MCHC 30.0 - 36.0 g/dL 31.8  RDW 11.5 - 15.5 % 13.7  Platelets 150 - 400 K/uL 248  nRBC 0.0 - 0.2 % 0.0  (L): Data is abnormally low   HISTORY OF PRESENTING ILLNESS: Alone.  Ambulating independently.  Caitlin Salazar 57 y.o.  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  Denies any visible blood in stools. Denies any dizziness or dyspnea. Pt does have occasional fatigue, it is much better than it was not yet normal. Appetite is good   Denies any constipation or diarrhea.  Denies any blood in stools or black or stools.   Review of Systems  Constitutional:  Negative for chills, diaphoresis, fever and weight loss.  HENT:  Negative for nosebleeds and sore throat.   Eyes:  Negative for double vision.  Respiratory:  Negative for cough, hemoptysis, sputum production, shortness of breath and wheezing.   Cardiovascular:  Negative for chest pain, palpitations, orthopnea and leg swelling.  Gastrointestinal:  Negative for abdominal pain, blood in stool, constipation, diarrhea, heartburn, melena, nausea and vomiting.  Genitourinary:  Negative for  dysuria, frequency and urgency.  Musculoskeletal:  Negative for back pain.  Skin: Negative.  Negative for itching and rash.  Neurological:  Negative for tingling, focal weakness, weakness and headaches.  Endo/Heme/Allergies:  Does not bruise/bleed easily.  Psychiatric/Behavioral:  Negative for depression. The patient is not nervous/anxious and does not have insomnia.     MEDICAL HISTORY:  Past Medical History:  Diagnosis Date   Allergic rhinitis    Arthritis    left knee   Asthma    when younger   Environmental allergies    Fatigue    Hypertension    Lack of appetite    Menorrhagia    Vitamin D deficiency     SURGICAL HISTORY: Past Surgical History:  Procedure Laterality Date   BREAST BIOPSY Left 01/06/2018   bx/clip/ neg   CESAREAN SECTION     COLONOSCOPY WITH PROPOFOL N/A 04/11/2015   Procedure: COLONOSCOPY WITH PROPOFOL;  Surgeon: Lucilla Lame, MD;  Location: [facility];  Service: Endoscopy;  Laterality: N/A;  PLEASE LEAVE PT START TIME FOR 930   GASTRIC BYPASS     POLYPECTOMY  04/11/2015   Procedure: POLYPECTOMY;  Surgeon: Lucilla Lame, MD;  Location: [facility];  Service: Endoscopy;;    SOCIAL HISTORY: Social History   Socioeconomic History   Marital status: Legally Separated    Spouse name: Not on file   Number of children: Not on file   Years of education:  Not on file   Highest education level: Not on file  Occupational History   Not on file  Tobacco Use   Smoking status: Never   Smokeless tobacco: Never  Vaping Use   Vaping Use: Never used  Substance and Sexual Activity   Alcohol use: Yes    Comment: last use 08/24/20 1x/mo   Drug use: Not Currently    Types: Marijuana    Comment: last use age 73   Sexual activity: Yes    Partners: Male  Other Topics Concern   Not on file  Social History Narrative   Comptroller; 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    Social Determinants of  Health   Financial Resource Strain: Not on file  Food Insecurity: Not on file  Transportation Needs: Not on file  Physical Activity: Not on file  Stress: Not on file  Social Connections: Not on file  Intimate Partner Violence: Not on file    FAMILY HISTORY: Family History  Problem Relation Age of Onset   Diabetes Father    Breast cancer Maternal Aunt    Breast cancer Cousin 26       mat cousin    ALLERGIES:  is allergic to penicillins.  MEDICATIONS:  Current Outpatient Medications  Medication Sig Dispense Refill   Cholecalciferol (VITAMIN D3) 1.25 MG (50000 UT) CAPS Take 1 capsule by mouth once a week.     fluticasone (FLONASE) 50 MCG/ACT nasal spray Place 2 sprays into both nostrils daily. 16 g 5   levocetirizine (XYZAL) 5 MG tablet Take 5 mg by mouth daily. AM     lisinopril-hydrochlorothiazide (ZESTORETIC) 10-12.5 MG tablet Take 1 tablet by mouth daily.     meloxicam (MOBIC) 15 MG tablet Take 15 mg by mouth daily.     oxybutynin (DITROPAN) 5 MG tablet Take 5 mg by mouth 2 (two) times daily.     SYRINGE-NEEDLE, DISP, 3 ML 21G X 1" 3 ML MISC For administration of b12 6 each 0   traZODone (DESYREL) 50 MG tablet Take 50 mg by mouth at bedtime.     albuterol (VENTOLIN HFA) 108 (90 Base) MCG/ACT inhaler Inhale into the lungs. Reported on 04/11/2015 (Patient not taking: Reported on 04/20/2022)     cyanocobalamin (VITAMIN B12) 1000 MCG/ML injection Inject 1 mL (1,000 mcg total) into the muscle every 30 (thirty) days. 6 mL 4   phentermine 30 MG capsule Take by mouth as needed. Reported on 04/11/2015 (Patient not taking: Reported on 05/19/2021)     No current facility-administered medications for this visit.      PHYSICAL EXAMINATION:   Vitals:   04/20/22 1318  BP: 137/72  Pulse: 60  Resp: 16  Temp: (!) 96.7 F (35.9 C)  SpO2: 100%    Filed Weights   04/20/22 1318  Weight: 269 lb (122 kg)     Physical Exam Vitals and nursing note reviewed.  HENT:     Head:  Normocephalic and atraumatic.     Mouth/Throat:     Pharynx: Oropharynx is clear.  Eyes:     Extraocular Movements: Extraocular movements intact.     Pupils: Pupils are equal, round, and reactive to light.  Cardiovascular:     Rate and Rhythm: Normal rate and regular rhythm.  Pulmonary:     Comments: Decreased breath sounds bilaterally.  Abdominal:     Palpations: Abdomen is soft.  Musculoskeletal:        General: Normal range of  motion.     Cervical back: Normal range of motion.  Skin:    General: Skin is warm.  Neurological:     General: No focal deficit present.     Mental Status: She is alert and oriented to person, place, and time.  Psychiatric:        Behavior: Behavior normal.        Judgment: Judgment normal.    LABORATORY DATA:  I have reviewed the data as listed Lab Results  Component Value Date   WBC 7.4 04/20/2022   HGB 11.6 (L) 04/20/2022   HCT 37.3 04/20/2022   MCV 90.5 04/20/2022   PLT 266 04/20/2022   Recent Labs    12/18/21 1304 04/20/22 1305  NA 135 138  K 4.5 4.4  CL 115* 110  CO2 20* 22  GLUCOSE 110* 89  BUN 34* 30*  CREATININE 1.45* 1.29*  CALCIUM 8.7* 8.9  GFRNONAA 42* 48*     No results found.  Symptomatic anemia # Anemia-hemoglobin 7.5 [PCP-labs/scanned] suspect -iron deficiency/history of bariatric surgery; s/p IV iron infusions. Patient explained that she would need likely maintenance. OCT 2023- Iron sat-23; ferritin-68  # Proceed with Venofer today- Hb- 11.7 -today. overall stable.   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 Wohl.   # CKD - stage III  s/p eval with nephology- GFR- 42. Stable.   # B12 def- at home/school-B12 injections.  [Refilled script]- stable.   # DISPOSITION: # Venofer today # follow up in 4 months- MD; labs- cbc;bmp; iron studies; ferritin; B12 levels;  possible venofer- Dr.B  All questions were answered. The patient knows to call the clinic with any problems,  questions or concerns.    Cammie Sickle, MD 04/20/2022 1:48 PM

## 2022-05-18 DIAGNOSIS — M1712 Unilateral primary osteoarthritis, left knee: Secondary | ICD-10-CM | POA: Insufficient documentation

## 2022-08-09 ENCOUNTER — Other Ambulatory Visit: Payer: Self-pay | Admitting: Nurse Practitioner

## 2022-08-10 ENCOUNTER — Encounter: Payer: Self-pay | Admitting: Internal Medicine

## 2022-08-17 MED FILL — Iron Sucrose Inj 20 MG/ML (Fe Equiv): INTRAVENOUS | Qty: 10 | Status: AC

## 2022-08-18 ENCOUNTER — Inpatient Hospital Stay: Payer: BC Managed Care – PPO

## 2022-08-18 ENCOUNTER — Inpatient Hospital Stay: Payer: BC Managed Care – PPO | Admitting: Internal Medicine

## 2022-08-18 NOTE — Assessment & Plan Note (Deleted)
#   Anemia-hemoglobin 7.5 [PCP-labs/scanned] suspect -iron deficiency/history of bariatric surgery; s/p IV iron infusions. Patient explained that she would need likely maintenance. OCT 2023- Iron sat-23; ferritin-68  # Proceed with Venofer today- Hb- 11.7 -today. overall stable.   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 Wohl.   # CKD - stage III  s/p eval with nephology- GFR- 42. Stable.   # B12 def- at home/school-B12 injections.  [Refilled script]- stable.   # DISPOSITION: # Venofer today # follow up in 4 months- MD; labs- cbc;bmp; iron studies; ferritin; B12 levels;  possible venofer- Dr.B  

## 2022-08-18 NOTE — Progress Notes (Deleted)
[facility] CONSULT NOTE  Patient Care Team: Cassell Clement, MD as PCP - General (Obstetrics and Gynecology) Earna Coder, MD as Consulting Physician (Oncology)  CHIEF COMPLAINTS/PURPOSE OF CONSULTATION: ANEMIA   HEMATOLOGY HISTORY:  # ANEMIA  colonoscopy; gastric by pass [2004]; 2020- [facility] IV venofer; colo- 2017 [Dr.Wohl]  #Gastric bypass  [[facility]; 2004]; CKD stage III    Latest Reference Range & Units 05/12/21 15:30  Iron 28 - 170 ug/dL 61  UIBC ug/dL 161  TIBC 096 - 045 ug/dL 409  Saturation Ratios 10.4 - 31.8 % 22  Ferritin 11 - 307 ng/mL 42  Vitamin B12 180 - 914 pg/mL 543  WBC 4.0 - 10.5 K/uL 7.1  RBC 3.87 - 5.11 MIL/uL 4.05  Hemoglobin 12.0 - 15.0 g/dL 81.1 (L)  HCT 91.4 - 78.2 % 35.5 (L)  MCV 80.0 - 100.0 fL 87.7  MCH 26.0 - 34.0 pg 27.9  MCHC 30.0 - 36.0 g/dL 95.6  RDW 21.3 - 08.6 % 13.7  Platelets 150 - 400 K/uL 248  nRBC 0.0 - 0.2 % 0.0  (L): Data is abnormally low   HISTORY OF PRESENTING ILLNESS: Alone.  Ambulating independently.  Caitlin Salazar 57 y.o.  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  Denies any visible blood in stools. Denies any dizziness or dyspnea. Pt does have occasional fatigue, it is much better than it was not yet normal. Appetite is good   Denies any constipation or diarrhea.  Denies any blood in stools or black or stools.   Review of Systems  Constitutional:  Negative for chills, diaphoresis, fever and weight loss.  HENT:  Negative for nosebleeds and sore throat.   Eyes:  Negative for double vision.  Respiratory:  Negative for cough, hemoptysis, sputum production, shortness of breath and wheezing.   Cardiovascular:  Negative for chest pain, palpitations, orthopnea and leg swelling.  Gastrointestinal:  Negative for abdominal pain, blood in stool, constipation, diarrhea, heartburn, melena, nausea and vomiting.  Genitourinary:  Negative for  dysuria, frequency and urgency.  Musculoskeletal:  Negative for back pain.  Skin: Negative.  Negative for itching and rash.  Neurological:  Negative for tingling, focal weakness, weakness and headaches.  Endo/Heme/Allergies:  Does not bruise/bleed easily.  Psychiatric/Behavioral:  Negative for depression. The patient is not nervous/anxious and does not have insomnia.     MEDICAL HISTORY:  Past Medical History:  Diagnosis Date  . Allergic rhinitis   . Arthritis    left knee  . Asthma    when younger  . Environmental allergies   . Fatigue   . Hypertension   . Lack of appetite   . Menorrhagia   . Vitamin D deficiency     SURGICAL HISTORY: Past Surgical History:  Procedure Laterality Date  . BREAST BIOPSY Left 01/06/2018   bx/clip/ neg  . CESAREAN SECTION    . COLONOSCOPY WITH PROPOFOL N/A 04/11/2015   Procedure: COLONOSCOPY WITH PROPOFOL;  Surgeon: Midge Minium, MD;  Location: [facility];  Service: Endoscopy;  Laterality: N/A;  PLEASE LEAVE PT START TIME FOR 930  . GASTRIC BYPASS    . POLYPECTOMY  04/11/2015   Procedure: POLYPECTOMY;  Surgeon: Midge Minium, MD;  Location: [facility];  Service: Endoscopy;;    SOCIAL HISTORY: Social History   Socioeconomic History  . Marital status: Legally Separated    Spouse name: Not on file  . Number of children: Not on file  . Years of education:  Not on file  . Highest education level: Not on file  Occupational History  . Not on file  Tobacco Use  . Smoking status: Never  . Smokeless tobacco: Never  Vaping Use  . Vaping Use: Never used  Substance and Sexual Activity  . Alcohol use: Yes    Comment: last use 08/24/20 1x/mo  . Drug use: Not Currently    Types: Marijuana    Comment: last use age 14  . Sexual activity: Yes    Partners: Male  Other Topics Concern  . 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     Social Determinants of Health   Financial Resource Strain: Not on file  Food Insecurity: Not on file  Transportation Needs: Not on file  Physical Activity: Not on file  Stress: Not on file  Social Connections: Not on file  Intimate Partner Violence: Not on file    FAMILY HISTORY: Family History  Problem Relation Age of Onset  . Diabetes Father   . Breast cancer Maternal Aunt   . Breast cancer Cousin 50       mat cousin    ALLERGIES:  is allergic to penicillins.  MEDICATIONS:  Current Outpatient Medications  Medication Sig Dispense Refill  . albuterol (VENTOLIN HFA) 108 (90 Base) MCG/ACT inhaler Inhale into the lungs. Reported on 04/11/2015 (Patient not taking: Reported on 04/20/2022)    . BD PLASTIPAK SYRINGE 21G X 1" 3 ML MISC USE FOR INJECT OF B12 6 each 0  . Cholecalciferol (VITAMIN D3) 1.25 MG (50000 UT) CAPS Take 1 capsule by mouth once a week.    . cyanocobalamin (VITAMIN B12) 1000 MCG/ML injection Inject 1 mL (1,000 mcg total) into the muscle every 30 (thirty) days. 6 mL 4  . fluticasone (FLONASE) 50 MCG/ACT nasal spray Place 2 sprays into both nostrils daily. 16 g 5  . levocetirizine (XYZAL) 5 MG tablet Take 5 mg by mouth daily. AM    . lisinopril-hydrochlorothiazide (ZESTORETIC) 10-12.5 MG tablet Take 1 tablet by mouth daily.    . meloxicam (MOBIC) 15 MG tablet Take 15 mg by mouth daily.    Marland Kitchen oxybutynin (DITROPAN) 5 MG tablet Take 5 mg by mouth 2 (two) times daily.    . phentermine 30 MG capsule Take by mouth as needed. Reported on 04/11/2015 (Patient not taking: Reported on 05/19/2021)    . traZODone (DESYREL) 50 MG tablet Take 50 mg by mouth at bedtime.     No current facility-administered medications for this visit.      PHYSICAL EXAMINATION:   There were no vitals filed for this visit.   There were no vitals filed for this visit.    Physical Exam Vitals and nursing note reviewed.  HENT:     Head: Normocephalic and atraumatic.     Mouth/Throat:      Pharynx: Oropharynx is clear.  Eyes:     Extraocular Movements: Extraocular movements intact.     Pupils: Pupils are equal, round, and reactive to light.  Cardiovascular:     Rate and Rhythm: Normal rate and regular rhythm.  Pulmonary:     Comments: Decreased breath sounds bilaterally.  Abdominal:     Palpations: Abdomen is soft.  Musculoskeletal:        General: Normal range of motion.     Cervical back: Normal range of motion.  Skin:    General: Skin is warm.  Neurological:  General: No focal deficit present.     Mental Status: She is alert and oriented to person, place, and time.  Psychiatric:        Behavior: Behavior normal.        Judgment: Judgment normal.   LABORATORY DATA:  I have reviewed the data as listed Lab Results  Component Value Date   WBC 7.4 04/20/2022   HGB 11.6 (L) 04/20/2022   HCT 37.3 04/20/2022   MCV 90.5 04/20/2022   PLT 266 04/20/2022   Recent Labs    12/18/21 1304 04/20/22 1305  NA 135 138  K 4.5 4.4  CL 115* 110  CO2 20* 22  GLUCOSE 110* 89  BUN 34* 30*  CREATININE 1.45* 1.29*  CALCIUM 8.7* 8.9  GFRNONAA 42* 48*      No results found.  No problem-specific Assessment & Plan notes found for this encounter.  All questions were answered. The patient knows to call the clinic with any problems, questions or concerns.    Earna Coder, MD 08/18/2022 10:14 AM

## 2022-08-25 ENCOUNTER — Encounter: Payer: Self-pay | Admitting: Internal Medicine

## 2022-08-25 ENCOUNTER — Inpatient Hospital Stay: Payer: BC Managed Care – PPO

## 2022-08-25 ENCOUNTER — Inpatient Hospital Stay: Payer: BC Managed Care – PPO | Attending: Internal Medicine

## 2022-08-25 ENCOUNTER — Inpatient Hospital Stay: Payer: BC Managed Care – PPO | Admitting: Internal Medicine

## 2022-08-25 VITALS — BP 130/86 | HR 75 | Temp 98.6°F | Ht 64.0 in | Wt 265.1 lb

## 2022-08-25 VITALS — BP 124/62 | HR 55 | Resp 16

## 2022-08-25 DIAGNOSIS — R202 Paresthesia of skin: Secondary | ICD-10-CM | POA: Insufficient documentation

## 2022-08-25 DIAGNOSIS — N183 Chronic kidney disease, stage 3 unspecified: Secondary | ICD-10-CM | POA: Diagnosis not present

## 2022-08-25 DIAGNOSIS — E538 Deficiency of other specified B group vitamins: Secondary | ICD-10-CM | POA: Insufficient documentation

## 2022-08-25 DIAGNOSIS — Z9884 Bariatric surgery status: Secondary | ICD-10-CM | POA: Insufficient documentation

## 2022-08-25 DIAGNOSIS — Z803 Family history of malignant neoplasm of breast: Secondary | ICD-10-CM | POA: Diagnosis not present

## 2022-08-25 DIAGNOSIS — D649 Anemia, unspecified: Secondary | ICD-10-CM | POA: Insufficient documentation

## 2022-08-25 DIAGNOSIS — Z79899 Other long term (current) drug therapy: Secondary | ICD-10-CM | POA: Diagnosis not present

## 2022-08-25 DIAGNOSIS — I129 Hypertensive chronic kidney disease with stage 1 through stage 4 chronic kidney disease, or unspecified chronic kidney disease: Secondary | ICD-10-CM | POA: Insufficient documentation

## 2022-08-25 LAB — CBC WITH DIFFERENTIAL/PLATELET
Abs Immature Granulocytes: 0.02 10*3/uL (ref 0.00–0.07)
Basophils Absolute: 0 10*3/uL (ref 0.0–0.1)
Basophils Relative: 1 %
Eosinophils Absolute: 0.1 10*3/uL (ref 0.0–0.5)
Eosinophils Relative: 2 %
HCT: 35.2 % — ABNORMAL LOW (ref 36.0–46.0)
Hemoglobin: 11.1 g/dL — ABNORMAL LOW (ref 12.0–15.0)
Immature Granulocytes: 0 %
Lymphocytes Relative: 18 %
Lymphs Abs: 1.4 10*3/uL (ref 0.7–4.0)
MCH: 28.2 pg (ref 26.0–34.0)
MCHC: 31.5 g/dL (ref 30.0–36.0)
MCV: 89.6 fL (ref 80.0–100.0)
Monocytes Absolute: 0.4 10*3/uL (ref 0.1–1.0)
Monocytes Relative: 5 %
Neutro Abs: 5.8 10*3/uL (ref 1.7–7.7)
Neutrophils Relative %: 74 %
Platelets: 243 10*3/uL (ref 150–400)
RBC: 3.93 MIL/uL (ref 3.87–5.11)
RDW: 13.1 % (ref 11.5–15.5)
WBC: 7.9 10*3/uL (ref 4.0–10.5)
nRBC: 0 % (ref 0.0–0.2)

## 2022-08-25 LAB — IRON AND TIBC
Iron: 39 ug/dL (ref 28–170)
Saturation Ratios: 16 % (ref 10.4–31.8)
TIBC: 252 ug/dL (ref 250–450)
UIBC: 213 ug/dL

## 2022-08-25 LAB — BASIC METABOLIC PANEL
Anion gap: 9 (ref 5–15)
BUN: 33 mg/dL — ABNORMAL HIGH (ref 6–20)
CO2: 20 mmol/L — ABNORMAL LOW (ref 22–32)
Calcium: 9.3 mg/dL (ref 8.9–10.3)
Chloride: 110 mmol/L (ref 98–111)
Creatinine, Ser: 1.16 mg/dL — ABNORMAL HIGH (ref 0.44–1.00)
GFR, Estimated: 55 mL/min — ABNORMAL LOW (ref >60)
Glucose, Bld: 159 mg/dL — ABNORMAL HIGH (ref 70–99)
Potassium: 4.4 mmol/L (ref 3.5–5.1)
Sodium: 139 mmol/L (ref 135–145)

## 2022-08-25 LAB — VITAMIN B12: Vitamin B-12: 676 pg/mL (ref 180–914)

## 2022-08-25 LAB — FERRITIN: Ferritin: 121 ng/mL (ref 11–307)

## 2022-08-25 MED ORDER — SODIUM CHLORIDE 0.9 % IV SOLN
Freq: Once | INTRAVENOUS | Status: AC
  Filled 2022-08-25: qty 250

## 2022-08-25 MED ORDER — SODIUM CHLORIDE 0.9 % IV SOLN
200.0000 mg | Freq: Once | INTRAVENOUS | Status: AC
  Administered 2022-08-25: 200 mg via INTRAVENOUS
  Filled 2022-08-25: qty 200

## 2022-08-25 NOTE — Progress Notes (Signed)
Pt tolerated treatment without complaints.  VSS.  Pt refused 30 minute post observation.  Pt understands risks.   

## 2022-08-25 NOTE — Assessment & Plan Note (Addendum)
#   Anemia-hemoglobin 7.5 [PCP-labs/scanned] suspect -iron deficiency/history of bariatric surgery; s/p IV iron infusions. Patient explained that she would need likely maintenance.   # Proceed with Venofer today- Hb- 10-11; FEB 2024-  Iron sat- 20; ferritin-92 . Proceed with venofer. Recommend barimelt+ iron [over-the-counter medication].  1 a day.  Sublingual to help with absorption   # Etiology: Likely secondary bariatric surgery/CKD stageIII; colonoscopy 2017-Dr. Wohl.   # CKD - stage III  s/p eval with nephology- GFR- 42. Stable.   # B12 def- at home/school-B12 injections.  [Refilled script]- stable.   # DISPOSITION: # Venofer today # again venofer in 1 week-  # follow up in 6 months- MD; labs- cbc;bmp; iron studies; ferritin; B12 levels;  possible venofer- Dr.B

## 2022-08-25 NOTE — Patient Instructions (Signed)

## 2022-08-25 NOTE — Progress Notes (Signed)
[name]  Patient Care Team: Cassell Clement, MD as PCP - General (Obstetrics and Gynecology) Earna Coder, MD as Consulting Physician (Oncology)  CHIEF COMPLAINTS/PURPOSE OF CONSULTATION: ANEMIA   HEMATOLOGY HISTORY:  # ANEMIA  colonoscopy; gastric by pass [2004]; 2020- [facility] IV venofer; colo- 2017 [Dr.Wohl]  #Gastric bypass  [[facility]; 2004]; CKD stage III    Latest Reference Range & Units 05/12/21 15:30  Iron 28 - 170 ug/dL 61  UIBC ug/dL 161  TIBC 096 - 045 ug/dL 409  Saturation Ratios 10.4 - 31.8 % 22  Ferritin 11 - 307 ng/mL 42  Vitamin B12 180 - 914 pg/mL 543  WBC 4.0 - 10.5 K/uL 7.1  RBC 3.87 - 5.11 MIL/uL 4.05  Hemoglobin 12.0 - 15.0 g/dL 81.1 (L)  HCT 91.4 - 78.2 % 35.5 (L)  MCV 80.0 - 100.0 fL 87.7  MCH 26.0 - 34.0 pg 27.9  MCHC 30.0 - 36.0 g/dL 95.6  RDW 21.3 - 08.6 % 13.7  Platelets 150 - 400 K/uL 248  nRBC 0.0 - 0.2 % 0.0  (L): Data is abnormally low   HISTORY OF PRESENTING ILLNESS: Alone.  Ambulating independently.  Caitlin Salazar 57 y.o.  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  Patient is still having a little bit of tingling in her left foot, which she is still in physical therapy and it does seem to be helping .Marland Kitchen  Denies any visible blood in stools. Denies any dizziness or dyspnea. Pt does have occasional fatigue, it is much better than it was not yet normal. Appetite is good   Denies any constipation or diarrhea.  Denies any blood in stools or black or stools.   Review of Systems  Constitutional:  Negative for chills, diaphoresis, fever and weight loss.  HENT:  Negative for nosebleeds and sore throat.   Eyes:  Negative for double vision.  Respiratory:  Negative for cough, hemoptysis, sputum production, shortness of breath and wheezing.   Cardiovascular:  Negative for chest pain, palpitations, orthopnea and leg swelling.   Gastrointestinal:  Negative for abdominal pain, blood in stool, constipation, diarrhea, heartburn, melena, nausea and vomiting.  Genitourinary:  Negative for dysuria, frequency and urgency.  Musculoskeletal:  Negative for back pain.  Skin: Negative.  Negative for itching and rash.  Neurological:  Negative for tingling, focal weakness, weakness and headaches.  Endo/Heme/Allergies:  Does not bruise/bleed easily.  Psychiatric/Behavioral:  Negative for depression. The patient is not nervous/anxious and does not have insomnia.     MEDICAL HISTORY:  Past Medical History:  Diagnosis Date   Allergic rhinitis    Arthritis    left knee   Asthma    when younger   Environmental allergies    Fatigue    Hypertension    Lack of appetite    Menorrhagia    Vitamin D deficiency     SURGICAL HISTORY: Past Surgical History:  Procedure Laterality Date   BREAST BIOPSY Left 01/06/2018   bx/clip/ neg   CESAREAN SECTION     COLONOSCOPY WITH PROPOFOL N/A 04/11/2015   Procedure: COLONOSCOPY WITH PROPOFOL;  Surgeon: Midge Minium, MD;  Location: [facility];  Service: Endoscopy;  Laterality: N/A;  PLEASE LEAVE PT START TIME FOR 930   GASTRIC BYPASS     POLYPECTOMY  04/11/2015   Procedure: POLYPECTOMY;  Surgeon: Midge Minium, MD;  Location: [facility];  Service: Endoscopy;;    SOCIAL HISTORY: Social History  Socioeconomic History   Marital status: Legally Separated    Spouse name: Not on file   Number of children: Not on file   Years of education: Not on file   Highest education level: Not on file  Occupational History   Not on file  Tobacco Use   Smoking status: Never   Smokeless tobacco: Never  Vaping Use   Vaping Use: Never used  Substance and Sexual Activity   Alcohol use: Yes    Comment: last use 08/24/20 1x/mo   Drug use: Not Currently    Types: Marijuana    Comment: last use age 80   Sexual activity: Yes    Partners: Male  Other Topics Concern   Not on file   Social History Narrative   Multimedia programmer; 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    Social Determinants of Health   Financial Resource Strain: Not on file  Food Insecurity: Not on file  Transportation Needs: Not on file  Physical Activity: Not on file  Stress: Not on file  Social Connections: Not on file  Intimate Partner Violence: Not on file    FAMILY HISTORY: Family History  Problem Relation Age of Onset   Diabetes Father    Breast cancer Maternal Aunt    Breast cancer Cousin 50       mat cousin    ALLERGIES:  is allergic to penicillins.  MEDICATIONS:  Current Outpatient Medications  Medication Sig Dispense Refill   albuterol (VENTOLIN HFA) 108 (90 Base) MCG/ACT inhaler Inhale into the lungs. Reported on 04/11/2015     BD PLASTIPAK SYRINGE 21G X 1" 3 ML MISC USE FOR INJECT OF B12 6 each 0   Cholecalciferol (VITAMIN D3) 1.25 MG (50000 UT) CAPS Take 1 capsule by mouth once a week.     cyanocobalamin (VITAMIN B12) 1000 MCG/ML injection Inject 1 mL (1,000 mcg total) into the muscle every 30 (thirty) days. 6 mL 4   fluticasone (FLONASE) 50 MCG/ACT nasal spray Place 2 sprays into both nostrils daily. 16 g 5   levocetirizine (XYZAL) 5 MG tablet Take 5 mg by mouth daily. AM     lisinopril-hydrochlorothiazide (ZESTORETIC) 10-12.5 MG tablet Take 1 tablet by mouth daily.     meloxicam (MOBIC) 15 MG tablet Take 15 mg by mouth daily.     oxybutynin (DITROPAN) 5 MG tablet Take 5 mg by mouth 2 (two) times daily.     traZODone (DESYREL) 50 MG tablet Take 50 mg by mouth at bedtime.     phentermine 30 MG capsule Take by mouth as needed. Reported on 04/11/2015 (Patient not taking: Reported on 05/19/2021)     No current facility-administered medications for this visit.      PHYSICAL EXAMINATION:   Vitals:   08/25/22 1412  BP: 130/86  Pulse: 75  Temp: 98.6 F (37 C)  SpO2: 100%     Filed Weights   08/25/22 1412  Weight: 265 lb  1.6 oz (120.2 kg)      Physical Exam Vitals and nursing note reviewed.  HENT:     Head: Normocephalic and atraumatic.     Mouth/Throat:     Pharynx: Oropharynx is clear.  Eyes:     Extraocular Movements: Extraocular movements intact.     Pupils: Pupils are equal, round, and reactive to light.  Cardiovascular:     Rate and Rhythm: Normal rate and regular rhythm.  Pulmonary:     Comments: Decreased breath sounds bilaterally.  Abdominal:     Palpations: Abdomen is soft.  Musculoskeletal:        General: Normal range of motion.     Cervical back: Normal range of motion.  Skin:    General: Skin is warm.  Neurological:     General: No focal deficit present.     Mental Status: She is alert and oriented to person, place, and time.  Psychiatric:        Behavior: Behavior normal.        Judgment: Judgment normal.    LABORATORY DATA:  I have reviewed the data as listed Lab Results  Component Value Date   WBC 7.9 08/25/2022   HGB 11.1 (L) 08/25/2022   HCT 35.2 (L) 08/25/2022   MCV 89.6 08/25/2022   PLT 243 08/25/2022   Recent Labs    12/18/21 1304 04/20/22 1305  NA 135 138  K 4.5 4.4  CL 115* 110  CO2 20* 22  GLUCOSE 110* 89  BUN 34* 30*  CREATININE 1.45* 1.29*  CALCIUM 8.7* 8.9  GFRNONAA 42* 48*     No results found.  Symptomatic anemia # Anemia-hemoglobin 7.5 [PCP-labs/scanned] suspect -iron deficiency/history of bariatric surgery; s/p IV iron infusions. Patient explained that she would need likely maintenance.   # Proceed with Venofer today- Hb- 10-11; FEB 2024-  Iron sat- 20; ferritin-92 . Proceed with venofer. Recommend barimelt+ iron [over-the-counter medication].  1 a day.  Sublingual to help with absorption   # Etiology: Likely secondary bariatric surgery/CKD stageIII; colonoscopy 2017-Dr. Wohl.   # CKD - stage III  s/p eval with nephology- GFR- 42. Stable.   # B12 def- at home/school-B12 injections.  [Refilled script]- stable.   # DISPOSITION: #  Venofer today # again venofer in 1 week-  # follow up in 6 months- MD; labs- cbc;bmp; iron studies; ferritin; B12 levels;  possible venofer- Dr.B  All questions were answered. The patient knows to call the clinic with any problems, questions or concerns.    Earna Coder, MD 08/25/2022 3:03 PM

## 2022-08-25 NOTE — Patient Instructions (Signed)
Recommend barimelt+ iron [over-the-counter medication/can buy online].  2 a day.  Sublingual to help with absorption

## 2022-08-25 NOTE — Progress Notes (Signed)
Patient is still having a little bit of tingling in her left foot, which she is still in physical therapy and it does seem to be helping.

## 2022-09-01 ENCOUNTER — Ambulatory Visit: Payer: BC Managed Care – PPO

## 2022-09-03 ENCOUNTER — Inpatient Hospital Stay: Payer: BC Managed Care – PPO

## 2022-09-07 ENCOUNTER — Inpatient Hospital Stay: Payer: BC Managed Care – PPO

## 2022-09-07 VITALS — BP 107/45 | HR 71 | Temp 97.5°F | Resp 16

## 2022-09-07 DIAGNOSIS — D649 Anemia, unspecified: Secondary | ICD-10-CM | POA: Diagnosis not present

## 2022-09-07 MED ORDER — SODIUM CHLORIDE 0.9 % IV SOLN
200.0000 mg | Freq: Once | INTRAVENOUS | Status: AC
  Administered 2022-09-07: 200 mg via INTRAVENOUS
  Filled 2022-09-07: qty 200

## 2022-09-07 MED ORDER — SODIUM CHLORIDE 0.9 % IV SOLN
Freq: Once | INTRAVENOUS | Status: AC
  Filled 2022-09-07: qty 250

## 2022-09-07 NOTE — Patient Instructions (Signed)

## 2023-02-24 ENCOUNTER — Ambulatory Visit: Payer: BC Managed Care – PPO

## 2023-02-24 ENCOUNTER — Other Ambulatory Visit: Payer: BC Managed Care – PPO

## 2023-02-24 ENCOUNTER — Ambulatory Visit: Payer: BC Managed Care – PPO | Admitting: Internal Medicine

## 2023-02-25 ENCOUNTER — Inpatient Hospital Stay: Payer: BC Managed Care – PPO | Admitting: Internal Medicine

## 2023-02-25 ENCOUNTER — Inpatient Hospital Stay: Payer: BC Managed Care – PPO

## 2023-02-25 ENCOUNTER — Ambulatory Visit: Payer: BC Managed Care – PPO

## 2023-02-25 ENCOUNTER — Other Ambulatory Visit: Payer: BC Managed Care – PPO

## 2023-02-25 ENCOUNTER — Encounter: Payer: Self-pay | Admitting: Internal Medicine

## 2023-02-25 ENCOUNTER — Inpatient Hospital Stay: Payer: BC Managed Care – PPO | Attending: Internal Medicine

## 2023-02-25 ENCOUNTER — Ambulatory Visit: Payer: BC Managed Care – PPO | Admitting: Internal Medicine

## 2023-02-25 VITALS — BP 119/45 | HR 76 | Temp 97.5°F | Ht 64.0 in | Wt 256.4 lb

## 2023-02-25 DIAGNOSIS — D649 Anemia, unspecified: Secondary | ICD-10-CM

## 2023-02-25 DIAGNOSIS — Z79899 Other long term (current) drug therapy: Secondary | ICD-10-CM | POA: Diagnosis not present

## 2023-02-25 DIAGNOSIS — E538 Deficiency of other specified B group vitamins: Secondary | ICD-10-CM

## 2023-02-25 DIAGNOSIS — N183 Chronic kidney disease, stage 3 unspecified: Secondary | ICD-10-CM | POA: Insufficient documentation

## 2023-02-25 DIAGNOSIS — Z9884 Bariatric surgery status: Secondary | ICD-10-CM | POA: Diagnosis not present

## 2023-02-25 DIAGNOSIS — K912 Postsurgical malabsorption, not elsewhere classified: Secondary | ICD-10-CM | POA: Diagnosis not present

## 2023-02-25 LAB — CBC (CANCER CENTER ONLY)
HCT: 36.9 % (ref 36.0–46.0)
Hemoglobin: 11.6 g/dL — ABNORMAL LOW (ref 12.0–15.0)
MCH: 28.4 pg (ref 26.0–34.0)
MCHC: 31.4 g/dL (ref 30.0–36.0)
MCV: 90.2 fL (ref 80.0–100.0)
Platelet Count: 262 10*3/uL (ref 150–400)
RBC: 4.09 MIL/uL (ref 3.87–5.11)
RDW: 13.2 % (ref 11.5–15.5)
WBC Count: 6.5 10*3/uL (ref 4.0–10.5)
nRBC: 0 % (ref 0.0–0.2)

## 2023-02-25 LAB — BASIC METABOLIC PANEL - CANCER CENTER ONLY
Anion gap: 7 (ref 5–15)
BUN: 42 mg/dL — ABNORMAL HIGH (ref 6–20)
CO2: 19 mmol/L — ABNORMAL LOW (ref 22–32)
Calcium: 9.1 mg/dL (ref 8.9–10.3)
Chloride: 112 mmol/L — ABNORMAL HIGH (ref 98–111)
Creatinine: 1.34 mg/dL — ABNORMAL HIGH (ref 0.44–1.00)
GFR, Estimated: 46 mL/min — ABNORMAL LOW (ref >60)
Glucose, Bld: 95 mg/dL (ref 70–99)
Potassium: 4.7 mmol/L (ref 3.5–5.1)
Sodium: 138 mmol/L (ref 135–145)

## 2023-02-25 LAB — IRON AND TIBC
Iron: 55 ug/dL (ref 28–170)
Saturation Ratios: 23 % (ref 10.4–31.8)
TIBC: 239 ug/dL — ABNORMAL LOW (ref 250–450)
UIBC: 184 ug/dL

## 2023-02-25 LAB — VITAMIN B12: Vitamin B-12: 544 pg/mL (ref 180–914)

## 2023-02-25 LAB — FERRITIN: Ferritin: 136 ng/mL (ref 11–307)

## 2023-02-25 MED ORDER — CYANOCOBALAMIN 1000 MCG/ML IJ SOLN: 1000.0000 ug | INTRAMUSCULAR | 4 refills | Status: DC

## 2023-02-25 NOTE — Assessment & Plan Note (Addendum)
#   Anemia-hemoglobin 7.5 [PCP-labs/scanned] suspect -iron deficiency/history of bariatric surgery; s/p IV iron infusions. Patient explained that she would need likely maintenance.   # Today- Hb- 11; DEC 2024-  Iron sat- 13; ferritin-121.HOLD  venofer. Continue barimelt+ iron [over-the-counter medication]. 1 a day.  Sublingual to help with absorption.   # Etiology: Likely secondary bariatric surgery/CKD stage III; colonoscopy 2017-Dr. Wohl.   # CKD - stage III  s/p eval with nephology [Dr.Korrapati]- GFR- 43 [on Mobic]. Recommend trial of volatren gel. Will discuss with Dr.korrapati- Stable.   # B12 def- at home/school-B12 injections.  [Refilled script]- stable.   # DISPOSITION: # HOLD venofer # follow up in 6 months- MD; labs- cbc;bmp; iron studies; ferritin; B12 levels;  possible venofer- Dr.B

## 2023-02-25 NOTE — Progress Notes (Signed)
[facility] CONSULT NOTE  Patient Care Team: Cassell Clement, MD as PCP - General (Obstetrics and Gynecology) Earna Coder, MD as Consulting Physician (Oncology)  CHIEF COMPLAINTS/PURPOSE OF CONSULTATION: ANEMIA   HEMATOLOGY HISTORY:  # ANEMIA  colonoscopy; gastric by pass [2004]; 2020- [facility] IV venofer; colo- 2017 [Dr.Wohl]  #Gastric bypass  [[facility]; 2004]; CKD stage III    Latest Reference Range & Units 05/12/21 15:30  Iron 28 - 170 ug/dL 61  UIBC ug/dL 425  TIBC 956 - 387 ug/dL 564  Saturation Ratios 10.4 - 31.8 % 22  Ferritin 11 - 307 ng/mL 42  Vitamin B12 180 - 914 pg/mL 543  WBC 4.0 - 10.5 K/uL 7.1  RBC 3.87 - 5.11 MIL/uL 4.05  Hemoglobin 12.0 - 15.0 g/dL 33.2 (L)  HCT 95.1 - 88.4 % 35.5 (L)  MCV 80.0 - 100.0 fL 87.7  MCH 26.0 - 34.0 pg 27.9  MCHC 30.0 - 36.0 g/dL 16.6  RDW 06.3 - 01.6 % 13.7  Platelets 150 - 400 K/uL 248  nRBC 0.0 - 0.2 % 0.0  (L): Data is abnormally low   HISTORY OF PRESENTING ILLNESS: Alone.  Ambulating independently.  Caitlin Salazar 57 y.o.  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  Denies any visible blood in stools. Denies any dizziness or dyspnea. Pt does have occasional fatigue, it is much better than it was not yet normal.  Denies any constipation or diarrhea.  Denies any blood in stools or black or stools.   Review of Systems  Constitutional:  Negative for chills, diaphoresis, fever and weight loss.  HENT:  Negative for nosebleeds and sore throat.   Eyes:  Negative for double vision.  Respiratory:  Negative for cough, hemoptysis, sputum production, shortness of breath and wheezing.   Cardiovascular:  Negative for chest pain, palpitations, orthopnea and leg swelling.  Gastrointestinal:  Negative for abdominal pain, blood in stool, constipation, diarrhea, heartburn, melena, nausea and vomiting.  Genitourinary:  Negative for dysuria,  frequency and urgency.  Musculoskeletal:  Negative for back pain.  Skin: Negative.  Negative for itching and rash.  Neurological:  Negative for tingling, focal weakness, weakness and headaches.  Endo/Heme/Allergies:  Does not bruise/bleed easily.  Psychiatric/Behavioral:  Negative for depression. The patient is not nervous/anxious and does not have insomnia.     MEDICAL HISTORY:  Past Medical History:  Diagnosis Date   Allergic rhinitis    Arthritis    left knee   Asthma    when younger   Environmental allergies    Fatigue    Hypertension    Lack of appetite    Menorrhagia    Vitamin D deficiency     SURGICAL HISTORY: Past Surgical History:  Procedure Laterality Date   BREAST BIOPSY Left 01/06/2018   bx/clip/ neg   CESAREAN SECTION     COLONOSCOPY WITH PROPOFOL N/A 04/11/2015   Procedure: COLONOSCOPY WITH PROPOFOL;  Surgeon: Midge Minium, MD;  Location: [facility];  Service: Endoscopy;  Laterality: N/A;  PLEASE LEAVE PT START TIME FOR 930   GASTRIC BYPASS     POLYPECTOMY  04/11/2015   Procedure: POLYPECTOMY;  Surgeon: Midge Minium, MD;  Location: [facility];  Service: Endoscopy;;    SOCIAL HISTORY: Social History   Socioeconomic History   Marital status: Legally Separated    Spouse name: Not on file   Number of children: Not on file   Years of education: Not on file  Highest education level: Not on file  Occupational History   Not on file  Tobacco Use   Smoking status: Never   Smokeless tobacco: Never  Vaping Use   Vaping status: Never Used  Substance and Sexual Activity   Alcohol use: Yes    Comment: last use 08/24/20 1x/mo   Drug use: Not Currently    Types: Marijuana    Comment: last use age 89   Sexual activity: Yes    Partners: Male  Other Topics Concern   Not on file  Social History Narrative   Multimedia programmer; 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    Social Drivers of Manufacturing engineer Strain: Not on File (03/29/2019)   Received from [employer] [facility], [employer]    Financial Resource Strain: 0  Food Insecurity: Not on File (03/29/2019)   Received from [place], [place]   Food Insecurity    Food: 0  Transportation Needs: Not on File (03/29/2019)   Received from [facility], [facility] [facility] Needs    Transportation: 0  Physical Activity: Not on File (03/29/2019)   Received from [place], [place]   Physical Activity    Physical Activity: 0  Stress: Not on File (03/29/2019)   Received from [facility], [place]   Stress    Stress: 0  Social Connections: Not on File (03/29/2019)   Received from [place], [place]   Social Connections    Social Connections and Isolation: 0  Intimate Partner Violence: Not on file    FAMILY HISTORY: Family History  Problem Relation Age of Onset   Diabetes Father    Breast cancer Maternal Aunt    Breast cancer Cousin 50       mat cousin    ALLERGIES:  is allergic to penicillins.  MEDICATIONS:  Current Outpatient Medications  Medication Sig Dispense Refill   albuterol (VENTOLIN HFA) 108 (90 Base) MCG/ACT inhaler Inhale into the lungs. Reported on 04/11/2015     BD PLASTIPAK SYRINGE 21G X 1" 3 ML MISC USE FOR INJECT OF B12 6 each 0   Cholecalciferol (VITAMIN D3) 1.25 MG (50000 UT) CAPS Take 1 capsule by mouth once a week.     fluticasone (FLONASE) 50 MCG/ACT nasal spray Place 2 sprays into both nostrils daily. 16 g 5   levocetirizine (XYZAL) 5 MG tablet Take 5 mg by mouth daily. AM     lisinopril-hydrochlorothiazide (ZESTORETIC) 10-12.5 MG tablet Take 1 tablet by mouth daily.     meloxicam (MOBIC) 15 MG tablet Take 15 mg by mouth daily.     oxybutynin (DITROPAN) 5 MG tablet Take 5 mg by mouth 2 (two) times daily.     traZODone (DESYREL) 50 MG tablet Take 50 mg by mouth at bedtime.     cyanocobalamin (VITAMIN B12) 1000 MCG/ML injection Inject 1 mL (1,000 mcg total) into the muscle every 30 (thirty) days. 6 mL 4   No  current facility-administered medications for this visit.      PHYSICAL EXAMINATION:   Vitals:   02/25/23 1420  BP: (!) 119/45  Pulse: 76  Temp: (!) 97.5 F (36.4 C)  SpO2: 100%     Filed Weights   02/25/23 1420  Weight: 256 lb 6.4 oz (116.3 kg)      Physical Exam Vitals and nursing note reviewed.  HENT:     Head: Normocephalic and atraumatic.     Mouth/Throat:     Pharynx: Oropharynx is clear.  Eyes:     Extraocular Movements: Extraocular movements intact.     Pupils: Pupils are equal, round, and reactive to light.  Cardiovascular:     Rate and Rhythm: Normal rate and regular rhythm.  Pulmonary:     Comments: Decreased breath sounds bilaterally.  Abdominal:     Palpations: Abdomen is soft.  Musculoskeletal:        General: Normal range of motion.     Cervical back: Normal range of motion.  Skin:    General: Skin is warm.  Neurological:     General: No focal deficit present.     Mental Status: She is alert and oriented to person, place, and time.  Psychiatric:        Behavior: Behavior normal.        Judgment: Judgment normal.    LABORATORY DATA:  I have reviewed the data as listed Lab Results  Component Value Date   WBC 6.5 02/25/2023   HGB 11.6 (L) 02/25/2023   HCT 36.9 02/25/2023   MCV 90.2 02/25/2023   PLT 262 02/25/2023   Recent Labs    04/20/22 1305 08/25/22 1412 02/25/23 1411  NA 138 139 138  K 4.4 4.4 4.7  CL 110 110 112*  CO2 22 20* 19*  GLUCOSE 89 159* 95  BUN 30* 33* 42*  CREATININE 1.29* 1.16* 1.34*  CALCIUM 8.9 9.3 9.1  GFRNONAA 48* 55* 46*     No results found.  Symptomatic anemia # Anemia-hemoglobin 7.5 [PCP-labs/scanned] suspect -iron deficiency/history of bariatric surgery; s/p IV iron infusions. Patient explained that she would need likely maintenance.   # Today- Hb- 11; DEC 2024-  Iron sat- 13; ferritin-121.HOLD  venofer. Continue barimelt+ iron [over-the-counter medication]. 1 a day.  Sublingual to help with  absorption.   # Etiology: Likely secondary bariatric surgery/CKD stage III; colonoscopy 2017-Dr. Wohl.   # CKD - stage III  s/p eval with nephology [Dr.Korrapati]- GFR- 43 [on Mobic]. Recommend trial of volatren gel. Will discuss with Dr.korrapati- Stable.   # B12 def- at home/school-B12 injections.  [Refilled script]- stable.   # DISPOSITION: # HOLD venofer # follow up in 6 months- MD; labs- cbc;bmp; iron studies; ferritin; B12 levels;  possible venofer- Dr.B  All questions were answered. The patient knows to call the clinic with any problems, questions or concerns.    Earna Coder, MD 02/25/2023 4:16 PM

## 2023-02-25 NOTE — Progress Notes (Signed)
Fatigue/weakness: yes Dyspena: no Light headedness: yes, not as bad as they use to be Blood in stool: no  Needs refill Vit B12 inj, pending.  Appetite 75% normal, no supplement drinks.

## 2023-03-01 ENCOUNTER — Other Ambulatory Visit: Payer: Self-pay | Admitting: Obstetrics and Gynecology

## 2023-03-01 DIAGNOSIS — Z1231 Encounter for screening mammogram for malignant neoplasm of breast: Secondary | ICD-10-CM

## 2023-03-14 ENCOUNTER — Encounter: Payer: Self-pay | Admitting: Internal Medicine

## 2023-03-29 ENCOUNTER — Ambulatory Visit
Admission: RE | Admit: 2023-03-29 | Discharge: 2023-03-29 | Disposition: A | Discharge status: Home / Self Care | Payer: 59 | Source: Ambulatory Visit | Attending: Obstetrics and Gynecology | Admitting: Obstetrics and Gynecology

## 2023-03-29 ENCOUNTER — Encounter: Payer: Self-pay | Admitting: Internal Medicine

## 2023-03-29 DIAGNOSIS — Z1231 Encounter for screening mammogram for malignant neoplasm of breast: Secondary | ICD-10-CM | POA: Insufficient documentation

## 2023-08-19 ENCOUNTER — Encounter: Payer: Self-pay | Admitting: Internal Medicine

## 2023-08-19 ENCOUNTER — Inpatient Hospital Stay: Payer: BC Managed Care – PPO | Admitting: Internal Medicine

## 2023-08-19 ENCOUNTER — Inpatient Hospital Stay: Payer: BC Managed Care – PPO | Attending: Internal Medicine

## 2023-08-19 ENCOUNTER — Inpatient Hospital Stay: Payer: BC Managed Care – PPO

## 2023-08-19 VITALS — BP 117/64 | HR 68 | Temp 97.5°F | Resp 16 | Ht 64.0 in | Wt 255.0 lb

## 2023-08-19 VITALS — BP 104/86 | HR 56 | Temp 96.0°F | Resp 19

## 2023-08-19 DIAGNOSIS — Z9884 Bariatric surgery status: Secondary | ICD-10-CM | POA: Insufficient documentation

## 2023-08-19 DIAGNOSIS — D649 Anemia, unspecified: Secondary | ICD-10-CM | POA: Diagnosis not present

## 2023-08-19 DIAGNOSIS — K912 Postsurgical malabsorption, not elsewhere classified: Secondary | ICD-10-CM | POA: Diagnosis not present

## 2023-08-19 LAB — FERRITIN: Ferritin: 122 ng/mL (ref 11–307)

## 2023-08-19 LAB — CBC WITH DIFFERENTIAL (CANCER CENTER ONLY)
Abs Immature Granulocytes: 0.02 10*3/uL (ref 0.00–0.07)
Basophils Absolute: 0 10*3/uL (ref 0.0–0.1)
Basophils Relative: 1 %
Eosinophils Absolute: 0.1 10*3/uL (ref 0.0–0.5)
Eosinophils Relative: 2 %
HCT: 34.4 % — ABNORMAL LOW (ref 36.0–46.0)
Hemoglobin: 10.9 g/dL — ABNORMAL LOW (ref 12.0–15.0)
Immature Granulocytes: 0 %
Lymphocytes Relative: 24 %
Lymphs Abs: 1.7 10*3/uL (ref 0.7–4.0)
MCH: 28.7 pg (ref 26.0–34.0)
MCHC: 31.7 g/dL (ref 30.0–36.0)
MCV: 90.5 fL (ref 80.0–100.0)
Monocytes Absolute: 0.4 10*3/uL (ref 0.1–1.0)
Monocytes Relative: 5 %
Neutro Abs: 4.9 10*3/uL (ref 1.7–7.7)
Neutrophils Relative %: 68 %
Platelet Count: 236 10*3/uL (ref 150–400)
RBC: 3.8 MIL/uL — ABNORMAL LOW (ref 3.87–5.11)
RDW: 13.7 % (ref 11.5–15.5)
WBC Count: 7.1 10*3/uL (ref 4.0–10.5)
nRBC: 0 % (ref 0.0–0.2)

## 2023-08-19 LAB — IRON AND TIBC
Iron: 42 ug/dL (ref 28–170)
Saturation Ratios: 17 % (ref 10.4–31.8)
TIBC: 248 ug/dL — ABNORMAL LOW (ref 250–450)
UIBC: 206 ug/dL

## 2023-08-19 LAB — BASIC METABOLIC PANEL - CANCER CENTER ONLY
Anion gap: 5 (ref 5–15)
BUN: 33 mg/dL — ABNORMAL HIGH (ref 6–20)
CO2: 19 mmol/L — ABNORMAL LOW (ref 22–32)
Calcium: 8.7 mg/dL — ABNORMAL LOW (ref 8.9–10.3)
Chloride: 114 mmol/L — ABNORMAL HIGH (ref 98–111)
Creatinine: 1.17 mg/dL — ABNORMAL HIGH (ref 0.44–1.00)
GFR, Estimated: 54 mL/min — ABNORMAL LOW (ref >60)
Glucose, Bld: 116 mg/dL — ABNORMAL HIGH (ref 70–99)
Potassium: 4.2 mmol/L (ref 3.5–5.1)
Sodium: 138 mmol/L (ref 135–145)

## 2023-08-19 LAB — VITAMIN B12: Vitamin B-12: 600 pg/mL (ref 180–914)

## 2023-08-19 MED ORDER — CYANOCOBALAMIN 1000 MCG/ML IJ SOLN
1000.0000 ug | INTRAMUSCULAR | 4 refills | Status: AC
Start: 2023-08-19 — End: ?

## 2023-08-19 MED ORDER — IRON SUCROSE 20 MG/ML IV SOLN
200.0000 mg | Freq: Once | INTRAVENOUS | Status: AC
  Administered 2023-08-19: 200 mg via INTRAVENOUS

## 2023-08-19 MED ORDER — BD PLASTIPAK SYRINGE 21G X 1" 3 ML MISC: 0 refills | Status: AC

## 2023-08-19 MED ORDER — SODIUM CHLORIDE 0.9% FLUSH
10.0000 mL | Freq: Once | INTRAVENOUS | Status: AC | PRN
  Administered 2023-08-19: 10 mL
  Filled 2023-08-19: qty 10

## 2023-08-19 NOTE — Assessment & Plan Note (Signed)
#   Anemia-hemoglobin 7.5 [PCP-labs/scanned] suspect -iron  deficiency/history of bariatric surgery; s/p IV iron  infusions. Patient explained that she would need likely maintenance.   # Today- Hb- 10.8- Continue barimelt+ iron  [over-the-counter medication]. Proceed with venofer  today-   # Etiology: Likely secondary bariatric surgery/CKD stage III; colonoscopy 2017-Dr. Wohl.   # CKD - stage III  s/p eval with nephology [Dr.Korrapati]- GFR- 43 [on Mobic ]. Recommend trial of volatren gel. Will discuss with Dr.korrapati- Stable.   # B12 def- at home/school-B12 injections.  [Refilled script]- stable.   # vit D def [weekly vit D]  # DISPOSITION: # Send refill for syringes# 6; 4 refills #  venofer  # follow up in 6 months- MD; labs- cbc;bmp; iron  studies; ferritin; B12 levels;  possible venofer - Dr.B

## 2023-08-19 NOTE — Progress Notes (Signed)
 [facility] CONSULT NOTE  Patient Care Team: [name]-Jonas, Lynde, MD as PCP - General (Obstetrics and Gynecology) Gwyn Leos, MD as Consulting Physician (Oncology)  CHIEF COMPLAINTS/PURPOSE OF CONSULTATION: ANEMIA   HEMATOLOGY HISTORY:  # ANEMIA  colonoscopy; gastric by pass [2004]; 2020- [facility] IV venofer ; colo- 2017 [Dr.Wohl]  #Gastric bypass  [[facility]; 2004]; CKD stage III    Latest Reference Range & Units 05/12/21 15:30  Iron  28 - 170 ug/dL 61  UIBC ug/dL 161  TIBC 096 - 045 ug/dL 409  Saturation Ratios 10.4 - 31.8 % 22  Ferritin 11 - 307 ng/mL 42  Vitamin B12 180 - 914 pg/mL 543  WBC 4.0 - 10.5 K/uL 7.1  RBC 3.87 - 5.11 MIL/uL 4.05  Hemoglobin 12.0 - 15.0 g/dL 81.1 (L)  HCT 91.4 - 78.2 % 35.5 (L)  MCV 80.0 - 100.0 fL 87.7  MCH 26.0 - 34.0 pg 27.9  MCHC 30.0 - 36.0 g/dL 95.6  RDW 21.3 - 08.6 % 13.7  Platelets 150 - 400 K/uL 248  nRBC 0.0 - 0.2 % 0.0  (L): Data is abnormally low   HISTORY OF PRESENTING ILLNESS: Alone.  Ambulating independently.  Caitlin Salazar 58 y.o.  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  Pt in for follow up, states she is feeling better. Request vit b12 injection be refilled. Admits compliance with her barimelt.   Denies any visible blood in stools. Denies any dizziness or dyspnea. Pt does have occasional fatigue. Denies any constipation or diarrhea.  Denies any blood in stools or black or stools.   Review of Systems  Constitutional:  Negative for chills, diaphoresis, fever and weight loss.  HENT:  Negative for nosebleeds and sore throat.   Eyes:  Negative for double vision.  Respiratory:  Negative for cough, hemoptysis, sputum production, shortness of breath and wheezing.   Cardiovascular:  Negative for chest pain, palpitations, orthopnea and leg swelling.  Gastrointestinal:  Negative for abdominal pain, blood in stool, constipation, diarrhea,  heartburn, melena, nausea and vomiting.  Genitourinary:  Negative for dysuria, frequency and urgency.  Musculoskeletal:  Negative for back pain.  Skin: Negative.  Negative for itching and rash.  Neurological:  Negative for tingling, focal weakness, weakness and headaches.  Endo/Heme/Allergies:  Does not bruise/bleed easily.  Psychiatric/Behavioral:  Negative for depression. The patient is not nervous/anxious and does not have insomnia.     MEDICAL HISTORY:  Past Medical History:  Diagnosis Date   Allergic rhinitis    Arthritis    left knee   Asthma    when younger   Environmental allergies    Fatigue    Hypertension    Lack of appetite    Menorrhagia    Vitamin D deficiency     SURGICAL HISTORY: Past Surgical History:  Procedure Laterality Date   BREAST BIOPSY Left 01/06/2018   bx/clip/ neg   CESAREAN SECTION     COLONOSCOPY WITH PROPOFOL  N/A 04/11/2015   Procedure: COLONOSCOPY WITH PROPOFOL ;  Surgeon: Marnee Sink, MD;  Location: [facility];  Service: Endoscopy;  Laterality: N/A;  PLEASE LEAVE PT START TIME FOR 930   GASTRIC BYPASS     POLYPECTOMY  04/11/2015   Procedure: POLYPECTOMY;  Surgeon: Marnee Sink, MD;  Location: [facility];  Service: Endoscopy;;    SOCIAL HISTORY: Social History   Socioeconomic History   Marital status: Legally Separated    Spouse name: Not on file   Number of  children: Not on file   Years of education: Not on file   Highest education level: Not on file  Occupational History   Not on file  Tobacco Use   Smoking status: Never   Smokeless tobacco: Never  Vaping Use   Vaping status: Never Used  Substance and Sexual Activity   Alcohol use: Yes    Comment: last use 08/24/20 1x/mo   Drug use: Not Currently    Types: Marijuana    Comment: last use age 30   Sexual activity: Yes    Partners: Male  Other Topics Concern   Not on file  Social History Narrative   Multimedia programmer; 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 No smoking; no alcohol.    Social Drivers of Corporate investment banker Strain: Not on File (03/29/2019)   Received from [employer] [facility], [employer]    Financial Resource Strain: 0  Food Insecurity: Not on File (03/29/2019)   Received from [place], [place]   Food Insecurity    Food: 0  Transportation Needs: Not on File (03/29/2019)   Received from [facility], [employer] [facility] Needs    Transportation: 0  Physical Activity: Not on File (03/29/2019)   Received from [place], [place]   Physical Activity    Physical Activity: 0  Stress: Not on File (03/29/2019)   Received from [facility], [place]   Stress    Stress: 0  Social Connections: Not on File (03/29/2019)   Received from [place], [place]   Social Connections    Social Connections and Isolation: 0  Intimate Partner Violence: Not on file    FAMILY HISTORY: Family History  Problem Relation Age of Onset   Diabetes Father    Breast cancer Maternal Aunt    Breast cancer Cousin 50       mat cousin    ALLERGIES:  is allergic to penicillins.  MEDICATIONS:  Current Outpatient Medications  Medication Sig Dispense Refill   Cholecalciferol (VITAMIN D3) 1.25 MG (50000 UT) CAPS Take 1 capsule by mouth once a week.     fluticasone  (FLONASE ) 50 MCG/ACT nasal spray Place 2 sprays into both nostrils daily. 16 g 5   levocetirizine (XYZAL) 5 MG tablet Take 5 mg by mouth daily. AM     lisinopril -hydrochlorothiazide (ZESTORETIC) 10-12.5 MG tablet Take 1 tablet by mouth daily.     meloxicam  (MOBIC ) 15 MG tablet Take 15 mg by mouth daily.     oxybutynin (DITROPAN) 5 MG tablet Take 5 mg by mouth 2 (two) times daily.     traZODone (DESYREL) 50 MG tablet Take 50 mg by mouth at bedtime.     albuterol (VENTOLIN HFA) 108 (90 Base) MCG/ACT inhaler Inhale into the lungs. Reported on 04/11/2015 (Patient not taking: Reported on 08/19/2023)     cyanocobalamin  (VITAMIN B12) 1000 MCG/ML injection Inject 1 mL (1,000 mcg total) into the  muscle every 30 (thirty) days. 6 mL 4   SYRINGE-NEEDLE, DISP, 3 ML (BD PLASTIPAK SYRINGE) 21G X 1" 3 ML MISC To be used for monthly B12 injections 6 each 0   No current facility-administered medications for this visit.      PHYSICAL EXAMINATION:   Vitals:   08/19/23 1429  BP: 117/64  Pulse: 68  Resp: 16  Temp: (!) 97.5 F (36.4 C)  SpO2: 100%     Filed Weights   08/19/23 1429  Weight: 255 lb (115.7 kg)      Physical Exam Vitals and nursing  note reviewed.  HENT:     Head: Normocephalic and atraumatic.     Mouth/Throat:     Pharynx: Oropharynx is clear.  Eyes:     Extraocular Movements: Extraocular movements intact.     Pupils: Pupils are equal, round, and reactive to light.  Cardiovascular:     Rate and Rhythm: Normal rate and regular rhythm.  Pulmonary:     Comments: Decreased breath sounds bilaterally.  Abdominal:     Palpations: Abdomen is soft.  Musculoskeletal:        General: Normal range of motion.     Cervical back: Normal range of motion.  Skin:    General: Skin is warm.  Neurological:     General: No focal deficit present.     Mental Status: She is alert and oriented to person, place, and time.  Psychiatric:        Behavior: Behavior normal.        Judgment: Judgment normal.    LABORATORY DATA:  I have reviewed the data as listed Lab Results  Component Value Date   WBC 7.1 08/19/2023   HGB 10.9 (L) 08/19/2023   HCT 34.4 (L) 08/19/2023   MCV 90.5 08/19/2023   PLT 236 08/19/2023   Recent Labs    08/25/22 1412 02/25/23 1411 08/19/23 1410  NA 139 138 138  K 4.4 4.7 4.2  CL 110 112* 114*  CO2 20* 19* 19*  GLUCOSE 159* 95 116*  BUN 33* 42* 33*  CREATININE 1.16* 1.34* 1.17*  CALCIUM 9.3 9.1 8.7*  GFRNONAA 55* 46* 54*     No results found.  Symptomatic anemia # Anemia-hemoglobin 7.5 [PCP-labs/scanned] suspect -iron  deficiency/history of bariatric surgery; s/p IV iron  infusions. Patient explained that she would need likely  maintenance.   # Today- Hb- 10.8- Continue barimelt+ iron  [over-the-counter medication]. Proceed with venofer  today-   # Etiology: Likely secondary bariatric surgery/CKD stage III; colonoscopy 2017-Dr. Wohl.   # CKD - stage III  s/p eval with nephology [Dr.Korrapati]- GFR- 43 [on Mobic ]. Recommend trial of volatren gel. Will discuss with Dr.korrapati- Stable.   # B12 def- at home/school-B12 injections.  [Refilled script]- stable.   # vit D def [weekly vit D]  # DISPOSITION: # Send refill for syringes# 6; 4 refills #  venofer  # follow up in 6 months- MD; labs- cbc;bmp; iron  studies; ferritin; B12 levels;  possible venofer - Dr.B  All questions were answered. The patient knows to call the clinic with any problems, questions or concerns.    Gwyn Leos, MD 08/19/2023 3:18 PM

## 2023-08-19 NOTE — Progress Notes (Signed)
 Pt in for follow up, states she is feeling better.  Request vit b12 injection be refilled.

## 2024-02-05 IMAGING — US US RENAL
1 series · 14 of 25 positions shown · non-contrast
Comparison: None

CLINICAL DATA: Stage 3 kidney disease

EXAM:
RENAL / URINARY TRACT ULTRASOUND COMPLETE

[Series 1: us renal · 0.23mm/px · 14 of 39 slices shown]
[im 1/39]
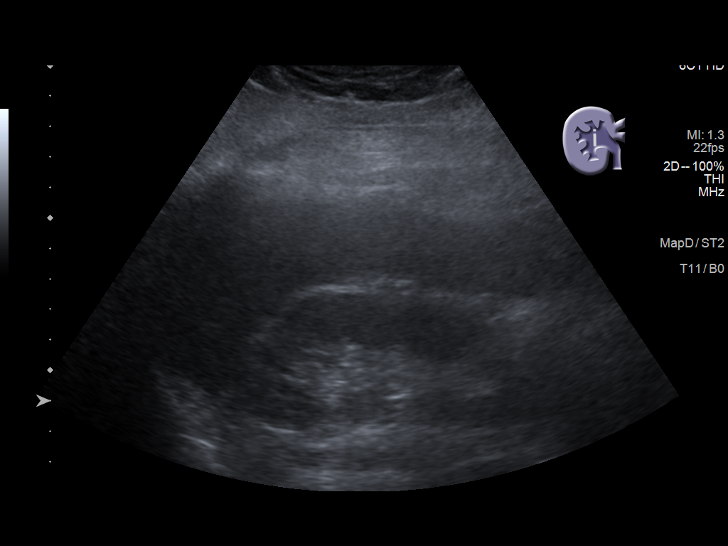
[im 4/39]
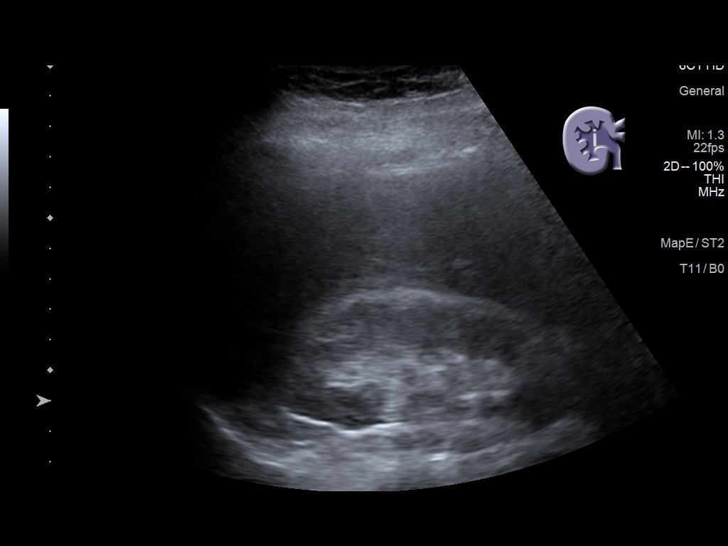
[im 7/39]
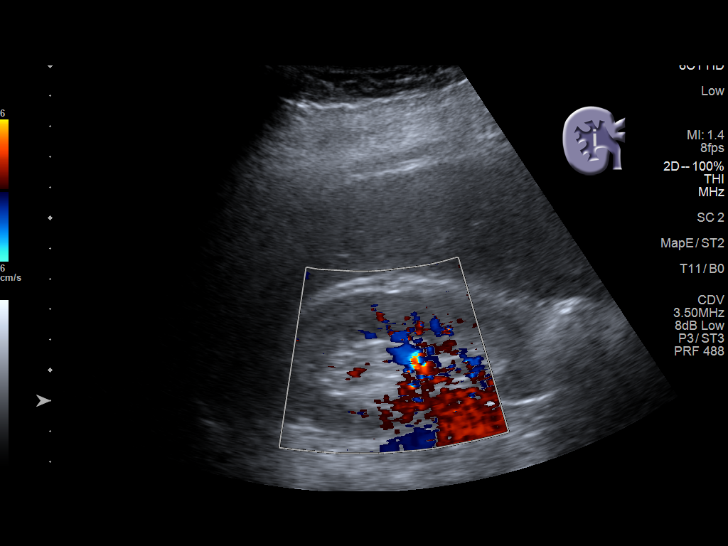
[im 10/39]
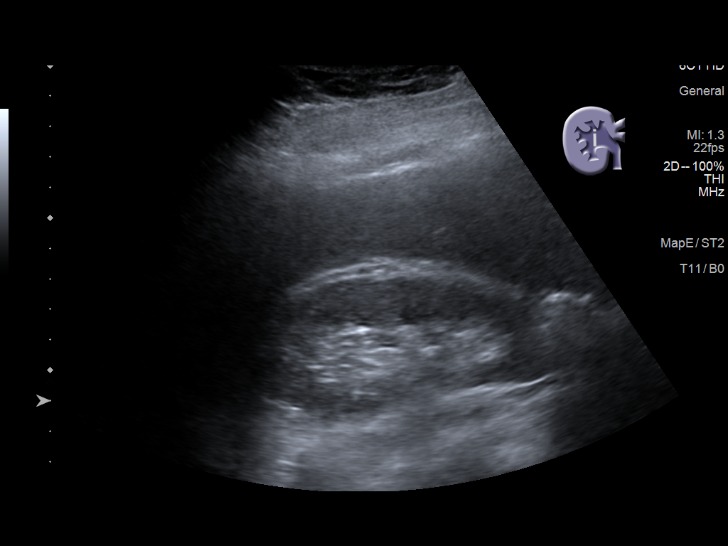
[im 13/39]
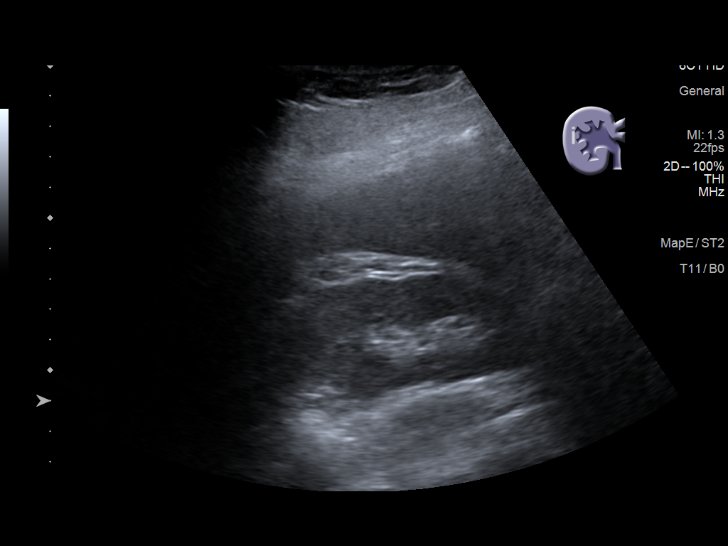
[im 15/39]
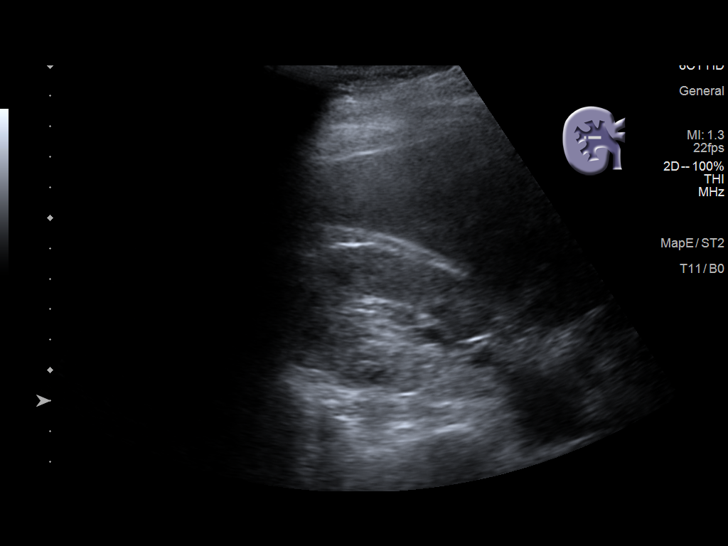
[im 18/39]
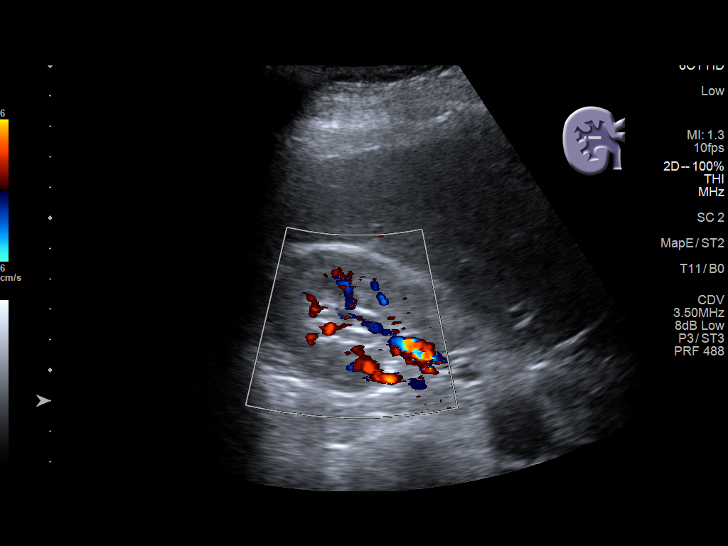
[im 21/39]
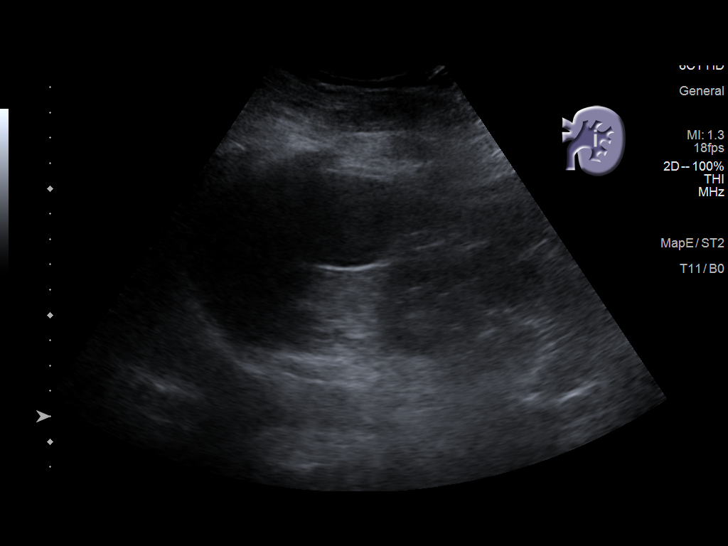
[im 24/39]
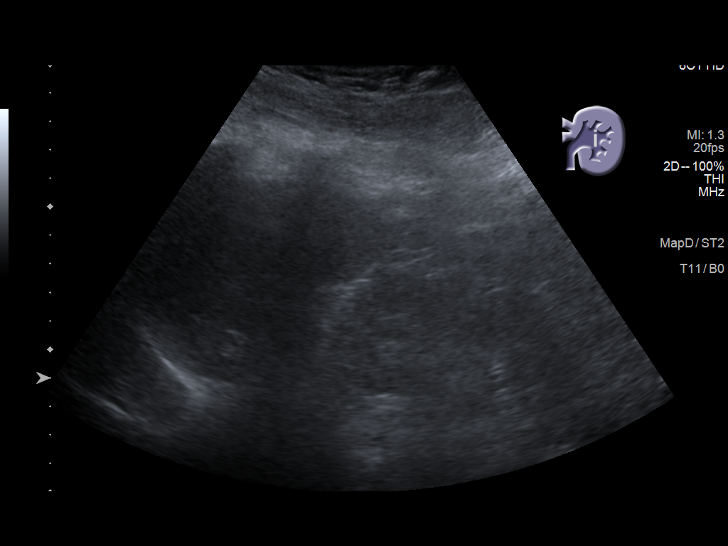
[im 26/39]
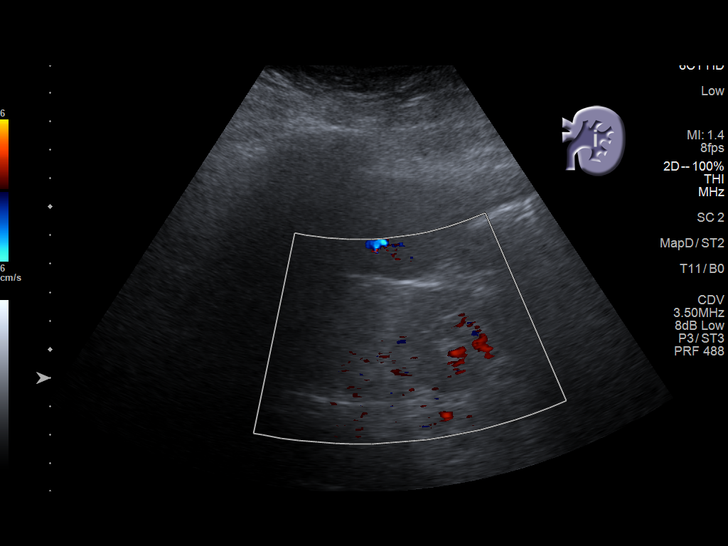
[im 29/39]
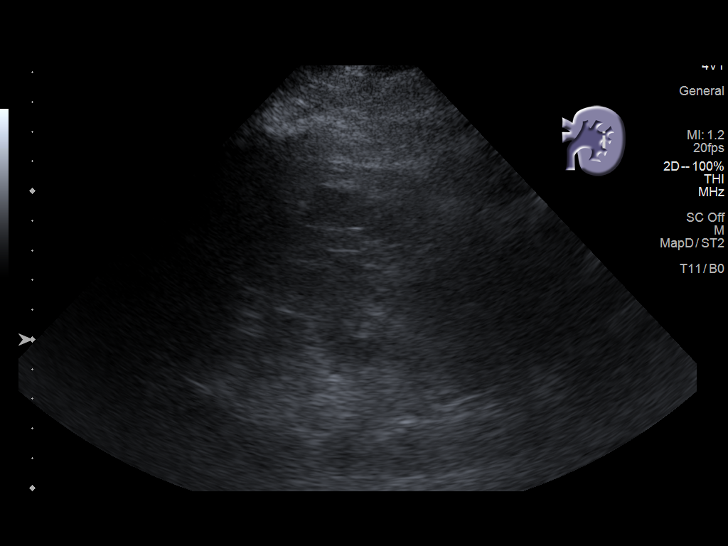
[im 32/39]
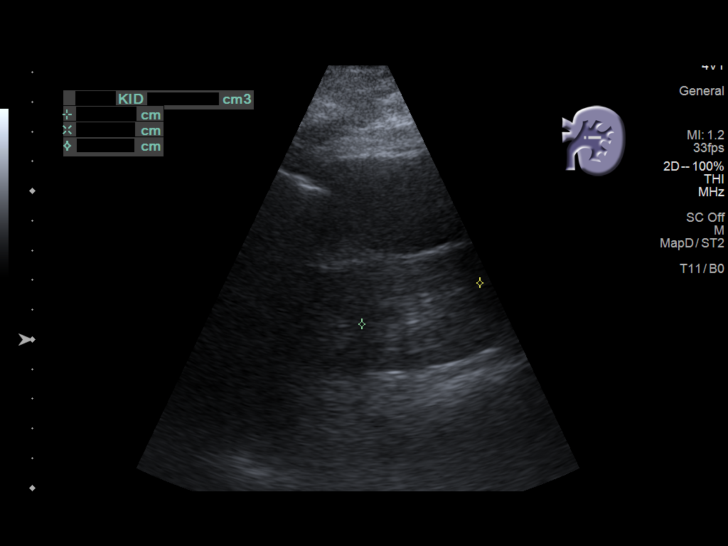
[im 35/39]
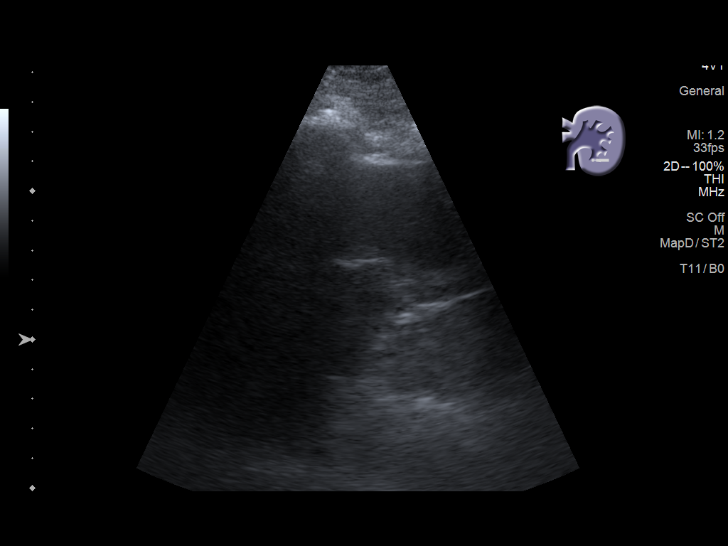
[im 39/39]
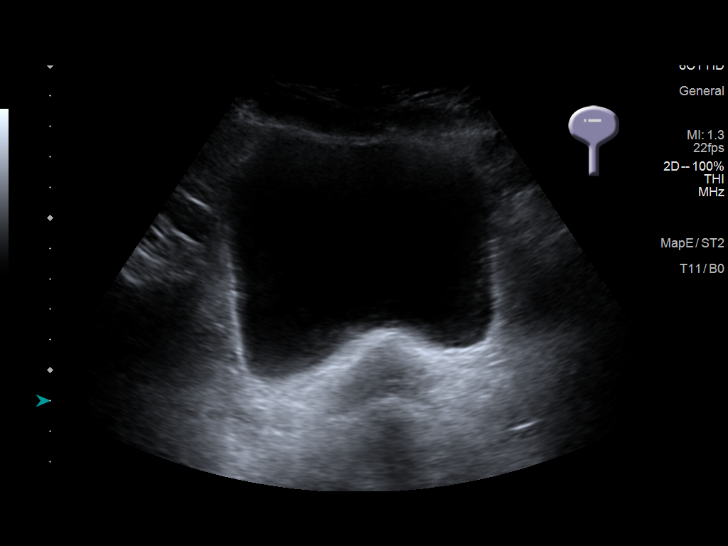

[14 of 25 positions shown; findings below may reference images not displayed]

FINDINGS: Right Kidney:

Renal measurements: 10.6 x 3.6 x 5.4 cm = volume: 107 mL. Cortex
appears echogenic. No mass or hydronephrosis.

Left Kidney:

Renal measurements: 9.5 x 4.7 x 4.2 cm = volume: 99 mL. Cortex
appears slightly echogenic. No mass or hydronephrosis.

Bladder:

Appears normal for degree of bladder distention.

Other:

None.
IMPRESSION: 1. Increased cortical echogenicity consistent with medical renal
disease. No hydronephrosis.

## 2024-02-21 ENCOUNTER — Other Ambulatory Visit: Payer: Self-pay | Admitting: *Deleted

## 2024-02-21 DIAGNOSIS — E538 Deficiency of other specified B group vitamins: Secondary | ICD-10-CM

## 2024-02-21 DIAGNOSIS — D649 Anemia, unspecified: Secondary | ICD-10-CM

## 2024-02-22 ENCOUNTER — Encounter: Payer: Self-pay | Admitting: Internal Medicine

## 2024-02-22 ENCOUNTER — Inpatient Hospital Stay: Attending: Internal Medicine

## 2024-02-22 ENCOUNTER — Ambulatory Visit

## 2024-02-22 ENCOUNTER — Inpatient Hospital Stay: Admitting: Internal Medicine

## 2024-02-22 VITALS — BP 123/61 | HR 59 | Temp 98.1°F | Resp 17

## 2024-02-22 DIAGNOSIS — D509 Iron deficiency anemia, unspecified: Secondary | ICD-10-CM | POA: Diagnosis present

## 2024-02-22 DIAGNOSIS — D649 Anemia, unspecified: Secondary | ICD-10-CM

## 2024-02-22 DIAGNOSIS — E559 Vitamin D deficiency, unspecified: Secondary | ICD-10-CM | POA: Diagnosis not present

## 2024-02-22 DIAGNOSIS — E538 Deficiency of other specified B group vitamins: Secondary | ICD-10-CM

## 2024-02-22 LAB — CBC WITH DIFFERENTIAL (CANCER CENTER ONLY)
Abs Immature Granulocytes: 0.03 K/uL (ref 0.00–0.07)
Basophils Absolute: 0.1 K/uL (ref 0.0–0.1)
Basophils Relative: 1 %
Eosinophils Absolute: 0.2 K/uL (ref 0.0–0.5)
Eosinophils Relative: 3 %
HCT: 33.4 % — ABNORMAL LOW (ref 36.0–46.0)
Hemoglobin: 10.5 g/dL — ABNORMAL LOW (ref 12.0–15.0)
Immature Granulocytes: 0 %
Lymphocytes Relative: 28 %
Lymphs Abs: 1.9 K/uL (ref 0.7–4.0)
MCH: 29.3 pg (ref 26.0–34.0)
MCHC: 31.4 g/dL (ref 30.0–36.0)
MCV: 93.3 fL (ref 80.0–100.0)
Monocytes Absolute: 0.5 K/uL (ref 0.1–1.0)
Monocytes Relative: 8 %
Neutro Abs: 4 K/uL (ref 1.7–7.7)
Neutrophils Relative %: 60 %
Platelet Count: 221 K/uL (ref 150–400)
RBC: 3.58 MIL/uL — ABNORMAL LOW (ref 3.87–5.11)
RDW: 13.2 % (ref 11.5–15.5)
WBC Count: 6.8 K/uL (ref 4.0–10.5)
nRBC: 0 % (ref 0.0–0.2)

## 2024-02-22 LAB — BASIC METABOLIC PANEL - CANCER CENTER ONLY
Anion gap: 10 (ref 5–15)
BUN: 29 mg/dL — ABNORMAL HIGH (ref 6–20)
CO2: 22 mmol/L (ref 22–32)
Calcium: 9.3 mg/dL (ref 8.9–10.3)
Chloride: 106 mmol/L (ref 98–111)
Creatinine: 1.35 mg/dL — ABNORMAL HIGH (ref 0.44–1.00)
GFR, Estimated: 45 mL/min — ABNORMAL LOW (ref >60)
Glucose, Bld: 93 mg/dL (ref 70–99)
Potassium: 4.9 mmol/L (ref 3.5–5.1)
Sodium: 138 mmol/L (ref 135–145)

## 2024-02-22 LAB — IRON AND TIBC
Iron: 40 ug/dL (ref 28–170)
Saturation Ratios: 17 % (ref 10.4–31.8)
TIBC: 238 ug/dL — ABNORMAL LOW (ref 250–450)
UIBC: 198 ug/dL

## 2024-02-22 LAB — VITAMIN B12: Vitamin B-12: 543 pg/mL (ref 180–914)

## 2024-02-22 LAB — FERRITIN: Ferritin: 198 ng/mL (ref 11–307)

## 2024-02-22 MED ORDER — SODIUM CHLORIDE 0.9% FLUSH
10.0000 mL | Freq: Once | INTRAVENOUS | Status: AC | PRN
  Administered 2024-02-22: 10 mL
  Filled 2024-02-22: qty 10

## 2024-02-22 MED ORDER — IRON SUCROSE 20 MG/ML IV SOLN
200.0000 mg | Freq: Once | INTRAVENOUS | Status: AC
  Administered 2024-02-22: 200 mg via INTRAVENOUS
  Filled 2024-02-22: qty 10

## 2024-02-22 NOTE — Assessment & Plan Note (Addendum)
#   Anemia-hemoglobin 7.5 [PCP-labs/scanned] suspect -iron  deficiency/history of bariatric surgery; s/p IV iron  infusions. Patient explained that she would need likely maintenance.   # Today- Hb- 10.8- Continue barimelt+ iron  [over-the-counter medication]. Proceed with venofer  today-   # Etiology: Likely secondary bariatric surgery/CKD stage III; colonoscopy 2017-Dr. Wohl.   # CKD - stage III  s/p eval with nephology [Dr.Korrapati]- GFR- 43 [on Mobic ]. Recommend trial of volatren gel. Will discuss with Dr.korrapati- Stable.   # B12 def- at home/school-B12 injections.  [Refilled script]- stable.   # vit D def [weekly vit D]  # DISPOSITION: # venofer  # venofer  in 1 week # follow up in 6 months- MD; labs- cbc;bmp; iron  studies; ferritin; B12 levels;  possible venofer - Dr.B

## 2024-02-22 NOTE — Progress Notes (Signed)
 [facility] CONSULT NOTE  Patient Care Team: [name]-Jonas, Lynde, MD as PCP - General (Obstetrics and Gynecology) Rennie Cindy SAUNDERS, MD as Consulting Physician (Oncology)  CHIEF COMPLAINTS/PURPOSE OF CONSULTATION: ANEMIA   HEMATOLOGY HISTORY:  # ANEMIA  colonoscopy; gastric by pass [2004]; 2020- [facility] IV venofer ; colo- 2017 [Dr.Wohl]  #Gastric bypass  [[facility]; 2004]; CKD stage III    Latest Reference Range & Units 05/12/21 15:30  Iron  28 - 170 ug/dL 61  UIBC ug/dL 783  TIBC 749 - 549 ug/dL 722  Saturation Ratios 10.4 - 31.8 % 22  Ferritin 11 - 307 ng/mL 42  Vitamin B12 180 - 914 pg/mL 543  WBC 4.0 - 10.5 K/uL 7.1  RBC 3.87 - 5.11 MIL/uL 4.05  Hemoglobin 12.0 - 15.0 g/dL 88.6 (L)  HCT 63.9 - 53.9 % 35.5 (L)  MCV 80.0 - 100.0 fL 87.7  MCH 26.0 - 34.0 pg 27.9  MCHC 30.0 - 36.0 g/dL 68.1  RDW 88.4 - 84.4 % 13.7  Platelets 150 - 400 K/uL 248  nRBC 0.0 - 0.2 % 0.0  (L): Data is abnormally low   HISTORY OF PRESENTING ILLNESS: Alone.  Ambulating independently.  Caitlin Salazar 58 y.o.  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  Discussed the use of AI scribe software for clinical note transcription with the patient, who gave verbal consent to proceed.  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  She has a history of anemia following gastric bypass surgery, with initial hemoglobin decline noted in 2022 (8.3 g/dL). Hemoglobin has remained suboptimal since, with a peak of 11 g/dL and current value of 89.4 g/dL. She has been adherent to oral iron , B12 injections, and multivitamin supplementation. She received an iron  infusion in June 2025.  She denies new or worsening symptoms, including fatigue, fevers, chills,  night sweats, or weight changes. She experiences gastrointestinal intolerance to red meat and turkey, which cause nausea and poor digestion since gastric bypass. She tolerates chicken and fish, and occasionally hamburgers, though these may also cause nausea and emesis.  She remains active, employed at a middle school, and is preparing for the holiday break. She is uncertain if a B12 refill is needed but currently has two vials at home.      Review of Systems  Constitutional:  Negative for chills, diaphoresis, fever and weight loss.  HENT:  Negative for nosebleeds and sore throat.   Eyes:  Negative for double vision.  Respiratory:  Negative for cough, hemoptysis, sputum production, shortness of breath and wheezing.   Cardiovascular:  Negative for chest pain, palpitations, orthopnea and leg swelling.  Gastrointestinal:  Negative for abdominal pain, blood in stool, constipation, diarrhea, heartburn, melena, nausea and vomiting.  Genitourinary:  Negative for dysuria, frequency and urgency.  Musculoskeletal:  Negative for back pain.  Skin: Negative.  Negative for itching and rash.  Neurological:  Negative for tingling, focal weakness, weakness and headaches.  Endo/Heme/Allergies:  Does not bruise/bleed easily.  Psychiatric/Behavioral:  Negative for depression. The patient is not nervous/anxious and does not have insomnia.     MEDICAL HISTORY:  Past Medical History:  Diagnosis Date   Allergic rhinitis    Arthritis    left knee   Asthma    when younger   Environmental allergies    Fatigue  Hypertension    Lack of appetite    Menorrhagia    Vitamin D deficiency     SURGICAL HISTORY: Past Surgical History:  Procedure Laterality Date   BREAST BIOPSY Left 01/06/2018   bx/clip/ neg   CESAREAN SECTION     COLONOSCOPY WITH PROPOFOL  N/A 04/11/2015   Procedure: COLONOSCOPY WITH PROPOFOL ;  Surgeon: Rogelia Copping, MD;  Location: [facility];  Service: Endoscopy;  Laterality: N/A;   PLEASE LEAVE PT START TIME FOR 930   GASTRIC BYPASS     POLYPECTOMY  04/11/2015   Procedure: POLYPECTOMY;  Surgeon: Rogelia Copping, MD;  Location: [facility];  Service: Endoscopy;;    SOCIAL HISTORY: Social History   Socioeconomic History   Marital status: Legally Separated    Spouse name: Not on file   Number of children: Not on file   Years of education: Not on file   Highest education level: Not on file  Occupational History   Not on file  Tobacco Use   Smoking status: Never   Smokeless tobacco: Never  Vaping Use   Vaping status: Never Used  Substance and Sexual Activity   Alcohol use: Yes    Comment: last use 08/24/20 1x/mo   Drug use: Not Currently    Types: Marijuana    Comment: last use age 74   Sexual activity: Yes    Partners: Male  Other Topics Concern   Not on file  Social History Narrative   Multimedia Programmer; 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    Social Drivers of Corporate Investment Banker Strain: Not on File (03/29/2019)   Received from [employer]    Financial Resource Strain: 0  Food Insecurity: Not on File (03/29/2019)   Received from [employer]    Food: 0  Transportation Needs: Not on File (03/29/2019)   Received from [employer] Needs    Transportation: 0  Physical Activity: Not on File (03/29/2019)   Received from [facility]   Physical Activity    Physical Activity: 0  Stress: Not on File (03/29/2019)   Received from [facility]   Stress    Stress: 0  Social Connections: Not on File (03/29/2019)   Received from [facility]   Social Connections    Social Connections and Isolation: 0  Intimate Partner Violence: Not on file    FAMILY HISTORY: Family History  Problem Relation Age of Onset   Diabetes Father    Breast cancer Maternal Aunt    Breast cancer Cousin 50       mat cousin    ALLERGIES:  is allergic to penicillins.  MEDICATIONS:  Current  Outpatient Medications  Medication Sig Dispense Refill   albuterol (VENTOLIN HFA) 108 (90 Base) MCG/ACT inhaler Inhale into the lungs. Reported on 04/11/2015     Cholecalciferol (VITAMIN D3) 1.25 MG (50000 UT) CAPS Take 1 capsule by mouth once a week.     cyanocobalamin  (VITAMIN B12) 1000 MCG/ML injection Inject 1 mL (1,000 mcg total) into the muscle every 30 (thirty) days. 6 mL 4   fluticasone  (FLONASE ) 50 MCG/ACT nasal spray Place 2 sprays into both nostrils daily. 16 g 5   levocetirizine (XYZAL) 5 MG tablet Take 5 mg by mouth daily. AM     lisinopril -hydrochlorothiazide (ZESTORETIC) 10-12.5 MG tablet Take 1 tablet by mouth daily.     meloxicam  (MOBIC ) 15 MG tablet Take 15 mg by mouth daily.  oxybutynin (DITROPAN) 5 MG tablet Take 5 mg by mouth 2 (two) times daily.     SYRINGE-NEEDLE, DISP, 3 ML (BD PLASTIPAK SYRINGE) 21G X 1 3 ML MISC To be used for monthly B12 injections 6 each 0   traZODone (DESYREL) 50 MG tablet Take 50 mg by mouth at bedtime.     No current facility-administered medications for this visit.      PHYSICAL EXAMINATION:   Vitals:   02/22/24 1512  BP: 126/74  Pulse: 60  Resp: 16  Temp: (!) 95.3 F (35.2 C)  SpO2: 100%     Filed Weights   02/22/24 1512  Weight: 251 lb 12.8 oz (114.2 kg)      Physical Exam Vitals and nursing note reviewed.  HENT:     Head: Normocephalic and atraumatic.     Mouth/Throat:     Pharynx: Oropharynx is clear.  Eyes:     Extraocular Movements: Extraocular movements intact.     Pupils: Pupils are equal, round, and reactive to light.  Cardiovascular:     Rate and Rhythm: Normal rate and regular rhythm.  Pulmonary:     Comments: Decreased breath sounds bilaterally.  Abdominal:     Palpations: Abdomen is soft.  Musculoskeletal:        General: Normal range of motion.     Cervical back: Normal range of motion.  Skin:    General: Skin is warm.  Neurological:     General: No focal deficit present.     Mental Status:  She is alert and oriented to person, place, and time.  Psychiatric:        Behavior: Behavior normal.        Judgment: Judgment normal.    LABORATORY DATA:  I have reviewed the data as listed Lab Results  Component Value Date   WBC 6.8 02/22/2024   HGB 10.5 (L) 02/22/2024   HCT 33.4 (L) 02/22/2024   MCV 93.3 02/22/2024   PLT 221 02/22/2024   Recent Labs    02/25/23 1411 08/19/23 1410  NA 138 138  K 4.7 4.2  CL 112* 114*  CO2 19* 19*  GLUCOSE 95 116*  BUN 42* 33*  CREATININE 1.34* 1.17*  CALCIUM 9.1 8.7*  GFRNONAA 46* 54*     No results found.  Symptomatic anemia # Anemia-hemoglobin 7.5 [PCP-labs/scanned] suspect -iron  deficiency/history of bariatric surgery; s/p IV iron  infusions. Patient explained that she would need likely maintenance.   # Today- Hb- 10.8- Continue barimelt+ iron  [over-the-counter medication]. Proceed with venofer  today-   # Etiology: Likely secondary bariatric surgery/CKD stage III; colonoscopy 2017-Dr. Wohl.   # CKD - stage III  s/p eval with nephology [Dr.Korrapati]- GFR- 43 [on Mobic ]. Recommend trial of volatren gel. Will discuss with Dr.korrapati- Stable.   # B12 def- at home/school-B12 injections.  [Refilled script]- stable.   # vit D def [weekly vit D]  # DISPOSITION: # Send refill for syringes# 6; 4 refills #  venofer  # follow up in 6 months- MD; labs- cbc;bmp; iron  studies; ferritin; B12 levels;  possible venofer - Dr.B  All questions were answered. The patient knows to call the clinic with any problems, questions or concerns.    Cindy JONELLE Joe, MD 02/22/2024 3:32 PM

## 2024-02-22 NOTE — Progress Notes (Signed)
 Fatigue/weakness: YES-HAS BEEN SICK THIS PAST WEEK Dyspena: NO  Light headedness: AT TIMES Blood in stool: NO

## 2024-02-29 DIAGNOSIS — E669 Obesity, unspecified: Secondary | ICD-10-CM | POA: Insufficient documentation

## 2024-03-05 ENCOUNTER — Telehealth: Payer: Self-pay | Admitting: Obstetrics and Gynecology

## 2024-03-05 ENCOUNTER — Inpatient Hospital Stay

## 2024-03-05 VITALS — BP 140/69 | HR 49 | Temp 97.7°F | Resp 16

## 2024-03-05 DIAGNOSIS — D649 Anemia, unspecified: Secondary | ICD-10-CM

## 2024-03-05 DIAGNOSIS — D509 Iron deficiency anemia, unspecified: Secondary | ICD-10-CM | POA: Diagnosis not present

## 2024-03-05 MED ORDER — IRON SUCROSE 20 MG/ML IV SOLN
200.0000 mg | Freq: Once | INTRAVENOUS | Status: AC
  Administered 2024-03-05: 200 mg via INTRAVENOUS
  Filled 2024-03-05: qty 10

## 2024-03-05 NOTE — Telephone Encounter (Signed)
 Copied from CRM [id number]. Topic: Appointments - Transfer of Care >> Mar 05, 2024  1:47 PM Delon T wrote: Pt is requesting to transfer FROM: Dr Nonda Pt is requesting to transfer TO: Evalene Arts Reason for requested transfer: Dr Nonda is retiring It is the responsibility of the team the patient would like to transfer to Thom Arts) to reach out to the patient if for any reason this transfer is not acceptable.

## 2024-03-05 NOTE — Patient Instructions (Signed)

## 2024-03-14 ENCOUNTER — Encounter: Payer: Self-pay | Admitting: Family Medicine

## 2024-03-14 ENCOUNTER — Ambulatory Visit: Admitting: Family Medicine

## 2024-03-14 VITALS — BP 137/88 | HR 89 | Resp 16 | Ht 64.0 in | Wt 247.0 lb

## 2024-03-14 DIAGNOSIS — N183 Chronic kidney disease, stage 3 unspecified: Secondary | ICD-10-CM

## 2024-03-14 DIAGNOSIS — Z9884 Bariatric surgery status: Secondary | ICD-10-CM | POA: Diagnosis not present

## 2024-03-14 DIAGNOSIS — M170 Bilateral primary osteoarthritis of knee: Secondary | ICD-10-CM | POA: Diagnosis not present

## 2024-03-14 DIAGNOSIS — Z7689 Persons encountering health services in other specified circumstances: Secondary | ICD-10-CM | POA: Diagnosis not present

## 2024-03-14 DIAGNOSIS — I10 Essential (primary) hypertension: Secondary | ICD-10-CM | POA: Diagnosis not present

## 2024-03-14 DIAGNOSIS — Z23 Encounter for immunization: Secondary | ICD-10-CM | POA: Diagnosis not present

## 2024-03-14 DIAGNOSIS — F5101 Primary insomnia: Secondary | ICD-10-CM | POA: Insufficient documentation

## 2024-03-14 MED ORDER — VALACYCLOVIR HCL 1 G PO TABS: 1000.0000 mg | ORAL_TABLET | Freq: Two times a day (BID) | ORAL | 3 refills | Status: AC

## 2024-03-14 MED ORDER — TRAZODONE HCL 50 MG PO TABS: 50.0000 mg | ORAL_TABLET | Freq: Every day | ORAL | 3 refills | Status: AC

## 2024-03-14 NOTE — Patient Instructions (Signed)
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
## 2024-04-04 ENCOUNTER — Other Ambulatory Visit: Payer: Self-pay | Admitting: Family Medicine

## 2024-04-04 DIAGNOSIS — Z1231 Encounter for screening mammogram for malignant neoplasm of breast: Secondary | ICD-10-CM

## 2024-04-25 ENCOUNTER — Ambulatory Visit: Admitting: Family Medicine

## 2024-05-08 ENCOUNTER — Ambulatory Visit

## 2024-08-31 ENCOUNTER — Inpatient Hospital Stay: Admitting: Internal Medicine

## 2024-08-31 ENCOUNTER — Inpatient Hospital Stay
